# Patient Record
Sex: Male | Born: 1965 | Race: White | Hispanic: No | Marital: Married | Smoking: Never smoker
Health system: Southern US, Community
[De-identification: ages and names within clinical notes are randomized; demographics above are authoritative.]

## PROBLEM LIST (undated history)

## (undated) DIAGNOSIS — H18609 Keratoconus, unspecified, unspecified eye: Secondary | ICD-10-CM

## (undated) DIAGNOSIS — G4733 Obstructive sleep apnea (adult) (pediatric): Secondary | ICD-10-CM

## (undated) DIAGNOSIS — L719 Rosacea, unspecified: Secondary | ICD-10-CM

## (undated) DIAGNOSIS — K649 Unspecified hemorrhoids: Secondary | ICD-10-CM

## (undated) DIAGNOSIS — I480 Paroxysmal atrial fibrillation: Secondary | ICD-10-CM

## (undated) DIAGNOSIS — I493 Ventricular premature depolarization: Secondary | ICD-10-CM

## (undated) HISTORY — DX: Paroxysmal atrial fibrillation: I48.0

## (undated) HISTORY — DX: Obstructive sleep apnea (adult) (pediatric): G47.33

## (undated) HISTORY — DX: Rosacea, unspecified: L71.9

## (undated) HISTORY — PX: VASECTOMY: SHX75

## (undated) HISTORY — DX: Keratoconus, unspecified, unspecified eye: H18.609

## (undated) HISTORY — DX: Ventricular premature depolarization: I49.3

## (undated) HISTORY — DX: Unspecified hemorrhoids: K64.9

---

## 2008-10-11 ENCOUNTER — Encounter: Admission: RE | Admit: 2008-10-11 | Discharge: 2008-10-11 | Payer: Self-pay | Admitting: Gastroenterology

## 2012-10-25 ENCOUNTER — Encounter (INDEPENDENT_AMBULATORY_CARE_PROVIDER_SITE_OTHER): Payer: Self-pay | Admitting: Surgery

## 2012-10-25 ENCOUNTER — Ambulatory Visit (INDEPENDENT_AMBULATORY_CARE_PROVIDER_SITE_OTHER): Payer: BC Managed Care – PPO | Admitting: Surgery

## 2012-10-25 VITALS — BP 124/72 | HR 64 | Temp 99.0°F | Resp 18 | Ht 74.0 in | Wt 187.8 lb

## 2012-10-25 DIAGNOSIS — R1900 Intra-abdominal and pelvic swelling, mass and lump, unspecified site: Secondary | ICD-10-CM

## 2012-10-25 DIAGNOSIS — R1909 Other intra-abdominal and pelvic swelling, mass and lump: Secondary | ICD-10-CM

## 2012-10-25 NOTE — Patient Instructions (Signed)

## 2012-10-25 NOTE — Progress Notes (Signed)
Chief Complaint:  Possible Right inguinal hernia  History of Present Illness:  John Ellis is an 47 y.o. male who is very active and who was found on physical exam by Dr. Duane Lope to have a weakness in the right inguinal region. The patient denies any symptoms. He works out and a fairly Psychologist, forensic. He has not had any symptoms suggesting incarceration.  History reviewed. No pertinent past medical history.  History reviewed. No pertinent past surgical history.  Current Outpatient Prescriptions  Medication Sig Dispense Refill  . B Complex Vitamins (VITAMIN B COMPLEX PO) Take by mouth.      Marland Kitchen BETAINE PO Take by mouth.      . Cholecalciferol (VITAMIN D-3 PO) Take by mouth.      . Loratadine (CLARITIN PO) Take by mouth.      . Misc Natural Products (NF FORMULAS TESTOSTERONE PO) Take by mouth.      . Multiple Vitamins-Minerals (MULTIVITAMIN WITH MINERALS) tablet Take 1 tablet by mouth daily.      Marland Kitchen POTASSIUM BICARBONATE PO Take by mouth.      Marland Kitchen UNABLE TO FIND CYTOSINE       No current facility-administered medications for this visit.   Review of patient's allergies indicates no known allergies. Family History  Problem Relation Age of Onset  . Cancer Mother     Breast,Colon [facility] Squamous Cell Carcinoma)  . Cancer Sister     Thyroid   Social History:   reports that he has never smoked. He has never used smokeless tobacco. He reports that he drinks about 1.2 ounces of alcohol per week. He reports that he does not use illicit drugs.   REVIEW OF SYSTEMS - PERTINENT POSITIVES ONLY: noncontributory  Physical Exam:   Blood pressure 124/72, pulse 64, temperature 99 F (37.2 C), temperature source Temporal, resp. rate 18, height 6\' 2"  (1.88 m), weight 187 lb 12.8 oz (85.186 kg). Body mass index is 24.1 kg/(m^2).  Gen:  WDWN white male NAD  Neurological: Alert and oriented to person, place, and time. Motor and sensory function is grossly intact  Head: Normocephalic and  atraumatic.  Eyes: Conjunctivae are normal. Pupils are equal, round, and reactive to light. No scleral icterus.  Neck: Normal range of motion. Neck supple. No tracheal deviation or thyromegaly present.  Cardiovascular:  SR without murmurs or gallops.  No carotid bruits Respiratory: Effort normal.  No respiratory distress. No chest wall tenderness. Breath sounds normal.  No wheezes, rales or rhonchi.  Abdomen:  Very thin abdomen with well-maintained musculature. Testes without masses. No inguinal canal bulges but bilateral bulges in the internal ring region right more prominent than the left. I don't think these represent clinically significant inguinal hernias. GU: Musculoskeletal: Normal range of motion. Extremities are nontender. No cyanosis, edema or clubbing noted Lymphadenopathy: No cervical, preauricular, postauricular or axillary adenopathy is present Skin: Skin is warm and dry. No rash noted. No diaphoresis. No erythema. No pallor. Pscyh: Normal mood and affect. Behavior is normal. Judgment and thought content normal.   LABORATORY RESULTS: No results found for this or any previous visit (from the past 48 hour(s)).  RADIOLOGY RESULTS: No results found.  Problem List: There are no active problems to display for this patient.   Assessment & Plan: Faint bulges in the inguinal canals bilaterally that I don't feel are clinically significant at the present time. Patient is encouraged to continue active workouts. We'll be happy to see you can as needed.    Matt  Hortencia Conradi, MD, [facility] [phone] beeper [phone]  10/25/2012 9:46 AM

## 2015-08-25 DIAGNOSIS — Z0000 Encounter for general adult medical examination without abnormal findings: Secondary | ICD-10-CM | POA: Diagnosis not present

## 2015-11-03 DIAGNOSIS — Z0000 Encounter for general adult medical examination without abnormal findings: Secondary | ICD-10-CM | POA: Diagnosis not present

## 2015-11-07 ENCOUNTER — Other Ambulatory Visit: Payer: Self-pay | Admitting: Cardiology

## 2015-11-07 DIAGNOSIS — R000 Tachycardia, unspecified: Secondary | ICD-10-CM | POA: Diagnosis not present

## 2015-11-13 ENCOUNTER — Ambulatory Visit
Admission: RE | Admit: 2015-11-13 | Discharge: 2015-11-13 | Disposition: A | Discharge status: Home / Self Care | Payer: No Typology Code available for payment source | Source: Ambulatory Visit | Attending: Cardiology | Admitting: Cardiology

## 2015-11-13 DIAGNOSIS — R000 Tachycardia, unspecified: Secondary | ICD-10-CM

## 2015-11-26 DIAGNOSIS — I493 Ventricular premature depolarization: Secondary | ICD-10-CM | POA: Diagnosis not present

## 2015-12-22 DIAGNOSIS — I480 Paroxysmal atrial fibrillation: Secondary | ICD-10-CM | POA: Diagnosis not present

## 2015-12-23 ENCOUNTER — Other Ambulatory Visit: Payer: Self-pay | Admitting: Cardiology

## 2015-12-24 ENCOUNTER — Other Ambulatory Visit: Payer: Self-pay | Admitting: Cardiology

## 2015-12-24 DIAGNOSIS — I480 Paroxysmal atrial fibrillation: Secondary | ICD-10-CM

## 2015-12-30 ENCOUNTER — Encounter: Payer: Self-pay | Admitting: Internal Medicine

## 2016-01-09 ENCOUNTER — Institutional Professional Consult (permissible substitution): Payer: No Typology Code available for payment source | Admitting: Internal Medicine

## 2016-01-12 ENCOUNTER — Telehealth: Payer: Self-pay | Admitting: Internal Medicine

## 2016-01-12 ENCOUNTER — Ambulatory Visit (HOSPITAL_COMMUNITY)
Admission: RE | Admit: 2016-01-12 | Discharge: 2016-01-12 | Disposition: A | Discharge status: Home / Self Care | Payer: BLUE CROSS/BLUE SHIELD | Source: Ambulatory Visit | Attending: Cardiology | Admitting: Cardiology

## 2016-01-12 DIAGNOSIS — I480 Paroxysmal atrial fibrillation: Secondary | ICD-10-CM

## 2016-01-12 DIAGNOSIS — Q676 Pectus excavatum: Secondary | ICD-10-CM | POA: Insufficient documentation

## 2016-01-12 DIAGNOSIS — I4891 Unspecified atrial fibrillation: Secondary | ICD-10-CM | POA: Diagnosis not present

## 2016-01-12 LAB — CREATININE, SERUM: Creatinine, Ser: 0.9 mg/dL (ref 0.61–1.24)

## 2016-01-12 MED ORDER — GADOBENATE DIMEGLUMINE 529 MG/ML IV SOLN
30.0000 mL | Freq: Once | INTRAVENOUS | Status: AC | PRN
  Administered 2016-01-12: 30 mL via INTRAVENOUS

## 2016-01-12 NOTE — Telephone Encounter (Signed)
It looks like pt has a Cardiac MRI today ordered by Dr Donnie Ahoilley.   I was unable to reach pt to confirm this, will forward to Dr Graciela HusbandsKlein for his review.

## 2016-01-12 NOTE — Telephone Encounter (Signed)
New Message  Pt voiced he wants MD Graciela HusbandsKlein to review his results from his appt today 8.21.17 prior to his appt on 8.22.17.  Please follow up with pt. Thanks!

## 2016-01-13 ENCOUNTER — Encounter: Payer: Self-pay | Admitting: Internal Medicine

## 2016-01-13 ENCOUNTER — Ambulatory Visit (INDEPENDENT_AMBULATORY_CARE_PROVIDER_SITE_OTHER): Payer: BLUE CROSS/BLUE SHIELD | Admitting: Internal Medicine

## 2016-01-13 ENCOUNTER — Encounter (INDEPENDENT_AMBULATORY_CARE_PROVIDER_SITE_OTHER): Payer: Self-pay

## 2016-01-13 VITALS — BP 124/80 | HR 49 | Ht 74.0 in | Wt 192.0 lb

## 2016-01-13 DIAGNOSIS — I4891 Unspecified atrial fibrillation: Secondary | ICD-10-CM

## 2016-01-13 NOTE — Telephone Encounter (Signed)
The patient has an appt at 3:30 pm today.  Results to be reviewed with the patient at that time.

## 2016-01-13 NOTE — Patient Instructions (Signed)
Medication Instructions: - Your physician recommends that you continue on your current medications as directed. Please refer to the Current Medication list given to you today.  Labwork: - none  Procedures/Testing: - none  Follow-Up: - Your physician recommends that you schedule a follow-up appointment in: 3 months with Dr. Klein.  Any Additional Special Instructions Will Be Listed Below (If Applicable).     If you need a refill on your cardiac medications before your next appointment, please call your pharmacy.   

## 2016-01-14 ENCOUNTER — Telehealth: Payer: Self-pay | Admitting: Internal Medicine

## 2016-01-14 ENCOUNTER — Encounter: Payer: Self-pay | Admitting: Internal Medicine

## 2016-01-14 NOTE — Telephone Encounter (Signed)
The pt is concerned that his MRI results will not flow into his [facility] because the test was ordered by Dr Donnie Ahoilley and Dr Donnie Ahoilley does not use [facility]. He is advised to wait until Dr Donnie Ahoilley or his office calls to give him his results and that if his results are not in his MYCHART at that time to call us and will will work on getting those results released to [facility].  He verbalized understanding and thanked me for calling him back.

## 2016-01-14 NOTE — Progress Notes (Signed)
ELECTROPHYSIOLOGY CONSULT NOTE  Patient ID: John Ellis, MRN: [id number], DOB/AGE: 1966-01-11 50 y.o. Admit date: (Not on file) Date of Consult: 01/14/2016  Primary Physician:  Duane Lope, MD Primary Cardiologist: [facility] Consulting Physician [facility]  Chief Complaint: Atrial fibrillation    HPI John Ellis is a 50 y.o. male  Referred because of atrial fibrillation.  He has a history of recurrent tachypalpitations. This occurred initially in 2013/14. There was an isolated episode at that time. He recalls regular tachycardia at about 150 bpm. It developed in the context of exercise where his heart rate did not resolve normally. It was frog negative and diuretic negative. It was associated with some chest fullness but no lightheadedness or shortness of breath.  He's subsequently noticed episodes on his heart rate monitor where for brief periods of time his heart rate was in the 200 range.  Number of months later, he had an episode that occurred some hours after exercise. His heart rate abruptly became 150 750-130 range and then after 30-40 minutes terminated abruptly and subsided. It was similar to the episode from 2013/14.  He's had 2 other distinct episodes for which is AliveCor has been useful. The first occurred after dinner where he had 3 or 4 glasses of wine. He was going to bed. Tachypalpitations that were irregular in March. They persisted for 30-40 minutes and he went to sleep. There were resolved by morning. AliveCor demonstrated atrial fibrillation.  He had another episode that was somewhat like this where he felt an irregular palpitation. AliveCor recordings demonstrates sinus rhythm with PVCs.  He exercises avidly; he does a lot of weight training and does a modest amount of aerobics. He is not a long distance endurance athlete.  He takes multiple supplements but reviewing what he is taking I did not see any stimulants in the mix.  He has been evaluated for sleep apnea on 2  occasions most recently 2015 at which time his AHI was less than 2  No family history of atrial fibrillation.  Cardiac evaluation undertaken by Dr. Donnie Aho straightened a normal echocardiogram with some degree of right sided chamber enlargement right atrial enlargement and a question of left ventricular hypertrophy versus a prominent moderator band. He subsequently underwent cardiac MRI scanning that demonstrated normal right atrial size moderate right ventricular enlargement. There is no evidence of left ventricular hypertrophy. There is no gadolinium enhancement.     Past Medical History:  Diagnosis Date  . Hemorrhoids   . Keratoconus   . Obstructive sleep apnea   . Paroxysmal atrial fibrillation (HCC)   . Rosacea   . Ventricular premature depolarization       Surgical History:  Past Surgical History:  Procedure Laterality Date  . VASECTOMY       Home Meds: Prior to Admission medications   Medication Sig Start Date End Date Taking? Authorizing Provider  Alpha-Lipoic Acid 150 MG CAPS Take by mouth as directed.   Yes Historical Provider, MD  B Complex Vitamins (VITAMIN B COMPLEX PO) Take 50 mg by mouth daily.    Yes Historical Provider, MD  Cholecalciferol (VITAMIN D-3 PO) Take 5,000 mg by mouth daily.    Yes Historical Provider, MD  Loratadine (CLARITIN PO) Take 10 mg by mouth daily as needed (allergies).    Yes Historical Provider, MD  Magnesium 250 MG TABS Take 1 tablet by mouth daily.   Yes Historical Provider, MD  Multiple Vitamins-Minerals (MULTIVITAMIN WITH MINERALS) tablet Take 3 tablets by mouth daily.  Yes Historical Provider, MD  NON FORMULARY FORTIFY CARB COMPLEX - Take 80 grams by mouth daily   Yes Historical Provider, MD  NON FORMULARY BCAA - Take 3000 mg by mouth daily   Yes Historical Provider, MD  Omega-3 Fatty Acids (FISH OIL) 500 MG CAPS Take 2,000 mg by mouth daily.    Yes Historical Provider, MD  saw palmetto 80 MG capsule Take 160 mg by mouth daily.   Yes  Historical Provider, MD  Turmeric (CURCUMIN 95 PO) Take by mouth. Take as directed   Yes Historical Provider, MD  UNABLE TO FIND as directed. CYTOZYME   Yes Historical Provider, MD  Vitamin K, Phytonadione, 100 MCG TABS Take 1 tablet by mouth daily.   Yes Historical Provider, MD  WHEY PROTEIN PO Take 66 g by mouth daily.   Yes Historical Provider, MD    Allergies: No Known Allergies  Social History   Social History  . Marital status: Married    Spouse name: N/A  . Number of children: N/A  . Years of education: N/A   Occupational History  . Estate manager/land agentfinance manager    Social History Main Topics  . Smoking status: Never Smoker  . Smokeless tobacco: Never Used  . Alcohol use 1.2 oz/week    1 Cans of beer, 1 Glasses of wine per week     Comment: Weekly.  . Drug use: No  . Sexual activity: Not on file   Other Topics Concern  . Not on file   Social History Narrative  . No narrative on file     Family History  Problem Relation Age of Onset  . Cancer Mother     Breast,Colon [facility]Basaloid Squamous Cell Carcinoma)  . Cancer Sister     Thyroid     ROS:  Please see the history of present illness.     All other systems reviewed and negative.    Physical Exam: Blood pressure 124/80, pulse (!) 49, height 6\' 2"  (1.88 m), weight 192 lb (87.1 kg), SpO2 97 %. General: Well developed, well nourished male in no acute distress. Head: Normocephalic, atraumatic, sclera non-icteric, no xanthomas, nares are without discharge. EENT: normal  Lymph Nodes:  none Neck: Negative for carotid bruits. JVD not elevated. Back:without scoliosis kyphosis Lungs: Clear bilaterally to auscultation without wheezes, rales, or rhonchi. Breathing is unlabored. Heart: RRR with S1 S2. No   murmur . No rubs, or gallops appreciated. Abdomen: Soft, non-tender, non-distended with normoactive bowel sounds. No hepatomegaly. No rebound/guarding. No obvious abdominal masses. Msk:  Strength and tone appear normal for  age. Extremities: No clubbing or cyanosis. No* edema.  Distal pedal pulses are 2+ and equal bilaterally. Skin: Warm and Dry Neuro: Alert and oriented X 3. CN III-XII intact Grossly normal sensory and motor function . Psych:  Responds to questions appropriately with a normal affect.      Labs: Cardiac Enzymes No results for input(s): CKTOTAL, CKMB, TROPONINI in the last 72 hours. CBC No results found for: WBC, HGB, HCT, MCV, PLT PROTIME: No results for input(s): LABPROT, INR in the last 72 hours. Chemistry  Recent Labs Lab 01/12/16 1110  CREATININE 0.90   Lipids No results found for: CHOL, HDL, LDLCALC, TRIG BNP No results found for: PROBNP Thyroid Function Tests: No results for input(s): TSH, T4TOTAL, T3FREE, THYROIDAB in the last 72 hours.  Invalid input(s): FREET3 Miscellaneous No results found for: DDIMER  Radiology/Studies:  Mr Card Morphology Wo/w Cm  Result Date: 01/13/2016 CLINICAL DATA:  Atrial fibrillation,  assess for structural cardiac abnormalities EXAM: CARDIAC MRI TECHNIQUE: The patient was scanned on a 1.5 Tesla GE magnet. A dedicated cardiac coil was used. Functional imaging was done using Fiesta sequences. 2,3, and 4 chamber views were done to assess for RWMA's. Modified Simpson's rule using a short axis stack was used to calculate an ejection fraction on a dedicated work Research officer, trade unionstation using Circle software. The patient received 30 cc of Multihance. After 10 minutes inversion recovery sequences were used to assess for infiltration and scar tissue. CONTRAST:  30 cc Multihance FINDINGS: The patient was noted to have pectus excavatum. Limited images of the lung fields showed no gross abnormalities. The left ventricle was normal in size and thickness. EF 65% with normal wall motion. Pectus excavatum creates some compression of RV, especially from the mid RV to the apical RV. The RV is moderately dilated by volume but systolic function is normal, RV EF 53%. The left atrium is  mildly dilated, the right atrium is normal in size. Aortic valve is trileaflet with no significant stenosis or regurgitation. There did not appear to be significant mitral regurgitation though flow sequences for quantitative assessment were not done. The aorta did not appear dilated. T1 images looked normal. On delayed enhancement imaging, there was no myocardial late gadolinium enhancement (LGE). MEASUREMENTS: MEASUREMENTS LV EDV 203 mL LV SV 132 mL LV EF 65% RV EDV 249 mL RV SV 131 mL RV EF 53% IMPRESSION: 1.  Normal LV size and systolic function, EF 65%. 2. Moderate dilated RV with normal systolic function, EF 53%. The patient has pectus excavatum which leads to some compression on the RV, especially the mid wall and apex. This may have led to alterations in RV volume. 3. No myocardial LGE (RV or LV), so no evidence for infiltrative disease, prior MI, or prior myocarditis. 4.  I do not suspect that ARVC is present. Dalton Mclean Electronically Signed   By: Marca Anconaalton  Mclean M.D.   On: 01/13/2016 09:00    EKG: Sinus and 49 Intervals 16/11/43 RSR prime*   Assessment and Plan:   Atrial fibrillation-paroxysmal  PVCs  Tachypalpitations-regular  Pectus    The patient has a constellation of symptoms. His atrial fibrillation occurred in the context of alcohol. This might have been the trigger. The other episode that he thought was atrial fibrillation turns out to be sinus with PVCs. His PVC burden does not seem to be excessive and there is no evidence of LV Function. At This Juncture, I Recommended That We Continue without intervention.  His thromboembolic risk profile sufficiently low that no anticoagulation or antiplatelet therapy is indicated.  We discussed also data related to endurance athletes and atrial fibrillation burden.  His sleep study reports were reviewed. His most recent AHI was less than 2. I don't know that there is any value in pursuing therapy for this.  His MRI scan failed to  demonstrate an explanation for right sided enlargement.  Apparently some degree of pectus can impact right ventricular outflow in his right ventricular function although I don't find comments impacting  right ventricular size.  I discussed this with Dr. DM; he thought the pectus might be contributing.         Sherryl MangesSteven Tsering Leaman

## 2016-01-14 NOTE — Telephone Encounter (Signed)
New message       Pt is calling to ask that his MRI test results be posted on mychart when read.  If any problems, please call

## 2016-01-15 ENCOUNTER — Encounter: Payer: Self-pay | Admitting: Cardiology

## 2016-01-15 DIAGNOSIS — I480 Paroxysmal atrial fibrillation: Secondary | ICD-10-CM | POA: Insufficient documentation

## 2016-01-20 ENCOUNTER — Telehealth: Payer: Self-pay | Admitting: Internal Medicine

## 2016-01-20 NOTE — Telephone Encounter (Signed)
New message        Pt reviewed his mychart and read that his EKG was abnormal. He would like to have the nurse call and discuss details with him. Please call.

## 2016-01-20 NOTE — Telephone Encounter (Signed)
Will forward to MD to review 

## 2016-01-22 NOTE — Telephone Encounter (Signed)
Late entry- Dr. Graciela HusbandsKlein attempted to call the patient on 8/30

## 2016-01-27 NOTE — Telephone Encounter (Signed)
Per Dr. Graciela HusbandsKlein- he spoke with the patient.

## 2016-04-09 ENCOUNTER — Encounter: Payer: Self-pay | Admitting: Internal Medicine

## 2016-04-14 DIAGNOSIS — R972 Elevated prostate specific antigen [PSA]: Secondary | ICD-10-CM | POA: Diagnosis not present

## 2016-04-20 ENCOUNTER — Ambulatory Visit (INDEPENDENT_AMBULATORY_CARE_PROVIDER_SITE_OTHER): Payer: BLUE CROSS/BLUE SHIELD | Admitting: Internal Medicine

## 2016-04-20 ENCOUNTER — Encounter: Payer: Self-pay | Admitting: Internal Medicine

## 2016-04-20 VITALS — BP 116/72 | HR 56 | Ht 74.0 in | Wt 198.0 lb

## 2016-04-20 DIAGNOSIS — I480 Paroxysmal atrial fibrillation: Secondary | ICD-10-CM

## 2016-04-20 DIAGNOSIS — I493 Ventricular premature depolarization: Secondary | ICD-10-CM | POA: Diagnosis not present

## 2016-04-20 MED ORDER — FLECAINIDE ACETATE 150 MG PO TABS: ORAL_TABLET | ORAL | 0 refills | Status: DC

## 2016-04-20 MED ORDER — APIXABAN 5 MG PO TABS: 5.0000 mg | ORAL_TABLET | Freq: Two times a day (BID) | ORAL | 0 refills | Status: DC

## 2016-04-20 NOTE — Patient Instructions (Signed)
Medication Instructions: - Your physician has recommended you make the following change in your medication:  1) Start eliquis 5 mg- take one tablet by mouth twice daily 2) Start flecainide 150 mg - take two tablets (300 mg) by mouth once as directed  Labwork: -  None ordered  Procedures/Testing: - none ordered  Follow-Up: - Your physician wants you to follow-up in: 6 months with Dr. Graciela HusbandsKlein. You will receive a reminder letter in the mail two months in advance. If you don't receive a letter, please call our office to schedule the follow-up appointment.  Any Additional Special Instructions Will Be Listed Below (If Applicable).     If you need a refill on your cardiac medications before your next appointment, please call your pharmacy.

## 2016-04-20 NOTE — Progress Notes (Signed)
Patient Care Team: Daisy Floroharles Alan Ross, MD as PCP - General (Family Medicine)   HPI  John Ellis is a 50 y.o. male seen in follow-up for paroxysmal atrial fibrillation. He has had no interval events  He has had a couple of episodes of tachypalpitations. Using the AliveCor monitor 1 was clearly atrial fibrillation. Another was associated with PVCs. He has also had regular tachycardia in the past.   The episode of atrial fibrillation was associated with an extra amount of alcohol intake.  Echocardiogram l t ([facility]) was normal       Past Medical History:  Diagnosis Date  . Hemorrhoids   . Keratoconus   . Obstructive sleep apnea   . Paroxysmal atrial fibrillation (HCC)   . Rosacea   . Ventricular premature depolarization     Past Surgical History:  Procedure Laterality Date  . VASECTOMY      Current Outpatient Prescriptions  Medication Sig Dispense Refill  . Alpha-Lipoic Acid 150 MG CAPS Take by mouth as directed.    . B Complex Vitamins (VITAMIN B COMPLEX PO) Take 50 mg by mouth daily.     . Cholecalciferol (VITAMIN D-3 PO) Take 5,000 mg by mouth daily.     . Loratadine (CLARITIN PO) Take 10 mg by mouth daily as needed (allergies).     . Magnesium 250 MG TABS Take 1 tablet by mouth daily.    . Multiple Vitamins-Minerals (MULTIVITAMIN WITH MINERALS) tablet Take 3 tablets by mouth daily.     . NON FORMULARY FORTIFY CARB COMPLEX - Take 80 grams by mouth daily    . NON FORMULARY BCAA - Take 3000 mg by mouth daily    . Omega-3 Fatty Acids (FISH OIL) 500 MG CAPS Take 2,000 mg by mouth daily.     . saw palmetto 80 MG capsule Take 160 mg by mouth daily.    . Turmeric (CURCUMIN 95 PO) Take by mouth. Take as directed    . UNABLE TO FIND as directed. CYTOZYME    . Vitamin K, Phytonadione, 100 MCG TABS Take 1 tablet by mouth daily.    . WHEY PROTEIN PO Take 66 g by mouth daily.    Marland Kitchen. apixaban (ELIQUIS) 5 MG TABS tablet Take 1 tablet (5 mg total) by mouth 2 (two) times daily.  28 tablet 0  . flecainide (TAMBOCOR) 150 MG tablet Take two tablets (300 mg) by mouth once as directed 4 tablet 0   No current facility-administered medications for this visit.     No Known Allergies    Review of Systems negative except from HPI and PMH  Physical Exam BP 116/72   Pulse (!) 56   Ht 6\' 2"  (1.88 m)   Wt 198 lb (89.8 kg)   SpO2 98%   BMI 25.42 kg/m  Well developed and well nourished in no acute distress HENT normal E scleral and icterus clear Neck Supple JVP flat; carotids brisk and full Clear to ausculation Regular rate and rhythm, no murmurs gallops or rub Soft with active bowel sounds No clubbing cyanosis  Edema Alert and oriented, grossly normal motor and sensory function Skin Warm and Dry  ECG demonstrates sinus rhythm at 54 Intervals 16/11/44 Incomplete right bundle branch block   Assessment and  Plan Atrial fib  PVCs  Regular tachypalpitations   Discussed options [place]. I advised to minimize his alcohol intake as this seemed to benefit trigger.  In the event that he has  atrial fibrillation that persists until the next day, he will try taking flecainide 300 mg  He is aware that normally we exposed the first time in hospital.  We will also give him one week's samples worth of apixoban. In the event that he wakes up the second warning still out of rhythm he will begin taking his ELIQUIS. He is advised to go to the emergency room locally if necessary otherwise we will see him when he returns home  We spent more than 50% of our >25 min visit in face to face counseling regarding the above        Current medicines are reviewed at length with the patient today .  The patient does not  have concerns regarding medicines.

## 2016-04-21 DIAGNOSIS — N401 Enlarged prostate with lower urinary tract symptoms: Secondary | ICD-10-CM | POA: Diagnosis not present

## 2016-04-21 DIAGNOSIS — N5201 Erectile dysfunction due to arterial insufficiency: Secondary | ICD-10-CM | POA: Diagnosis not present

## 2016-04-21 DIAGNOSIS — R972 Elevated prostate specific antigen [PSA]: Secondary | ICD-10-CM | POA: Diagnosis not present

## 2016-04-21 DIAGNOSIS — R3915 Urgency of urination: Secondary | ICD-10-CM | POA: Diagnosis not present

## 2016-04-23 DIAGNOSIS — Z86010 Personal history of colonic polyps: Secondary | ICD-10-CM | POA: Diagnosis not present

## 2016-04-23 DIAGNOSIS — Z8371 Family history of colonic polyps: Secondary | ICD-10-CM | POA: Diagnosis not present

## 2016-04-23 DIAGNOSIS — Z800 Family history of malignant neoplasm of digestive organs: Secondary | ICD-10-CM | POA: Diagnosis not present

## 2016-04-23 DIAGNOSIS — Z8679 Personal history of other diseases of the circulatory system: Secondary | ICD-10-CM | POA: Diagnosis not present

## 2016-05-14 DIAGNOSIS — J209 Acute bronchitis, unspecified: Secondary | ICD-10-CM | POA: Diagnosis not present

## 2016-05-27 DIAGNOSIS — B029 Zoster without complications: Secondary | ICD-10-CM | POA: Diagnosis not present

## 2016-06-07 DIAGNOSIS — B029 Zoster without complications: Secondary | ICD-10-CM | POA: Diagnosis not present

## 2016-06-17 DIAGNOSIS — B0229 Other postherpetic nervous system involvement: Secondary | ICD-10-CM | POA: Diagnosis not present

## 2016-06-17 DIAGNOSIS — L578 Other skin changes due to chronic exposure to nonionizing radiation: Secondary | ICD-10-CM | POA: Diagnosis not present

## 2016-08-13 DIAGNOSIS — J358 Other chronic diseases of tonsils and adenoids: Secondary | ICD-10-CM | POA: Diagnosis not present

## 2016-08-13 DIAGNOSIS — R0981 Nasal congestion: Secondary | ICD-10-CM | POA: Diagnosis not present

## 2016-08-13 DIAGNOSIS — G4730 Sleep apnea, unspecified: Secondary | ICD-10-CM | POA: Diagnosis not present

## 2016-09-02 IMAGING — MR MR CARD MORPHOLOGY WO/W CM
14 of 15 series · 39 of 40 positions shown · IV contrast (30    MH)
Comparison: none

CLINICAL DATA: Atrial fibrillation, assess for structural cardiac
abnormalities

EXAM:
CARDIAC MRI
TECHNIQUE: The patient was scanned on a 1.5 Tesla GE magnet. A dedicated
cardiac coil was used. Functional imaging was done using Fiesta
sequences. [DATE], and 4 chamber views were done to assess for RWMA's.
Modified Erxleben rule using a short axis stack was used to
calculate an ejection fraction on a dedicated work station using
Circle software. The patient received 30 cc of Multihance. After 10
minutes inversion recovery sequences were used to assess for
infiltration and scar tissue.
CONTRAST:  30 cc Multihance

[Series 3: bSSFP · sagittal · 8.0mm · 1.37mm/px · 1 of 18 slices shown (1 of 8)]
[im 1/18]
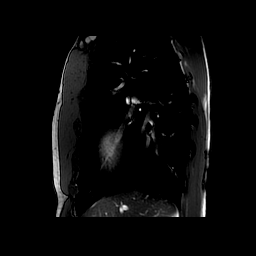

[Series 4: bSSFP · coronal · 8.0mm · 1.37mm/px · 1 of 20 slices shown (2 of 8)]
[im 1/20]
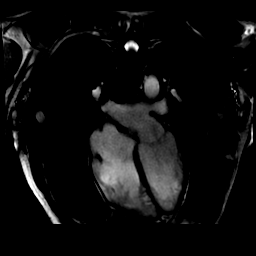

[Series 5: bSSFP · coronal · 8.0mm · 1.37mm/px · 1 of 80 slices shown (3 of 8)]
[im 1/80]
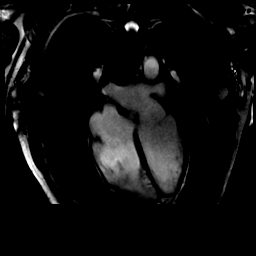

[Series 8: bSSFP · coronal · 8.0mm · 1.52mm/px · 1 of 80 slices shown (4 of 8)]
[im 1/80]
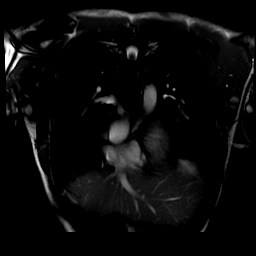

[Series 10: bSSFP · axial · 8.0mm · 1.25mm/px · z∈[-39,+65]mm · 9 of 340 slices shown (5 of 8)]
[im 1/340]
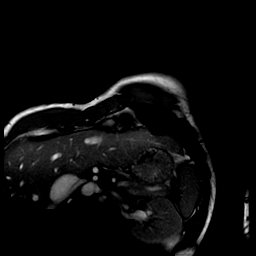
[im 43/340]
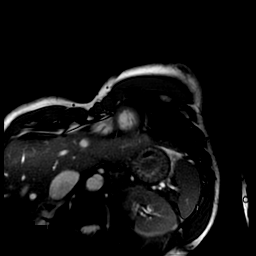
[im 85/340]
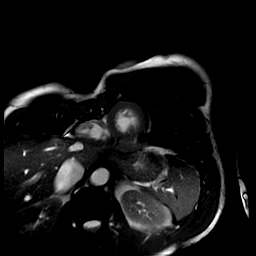
[im 128/340]
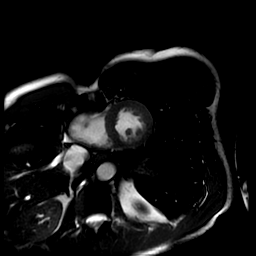
[im 170/340]
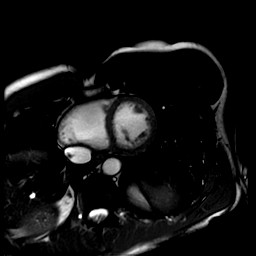
[im 212/340]
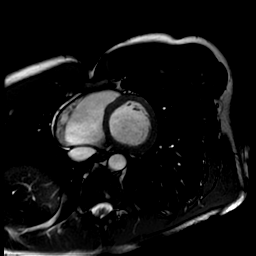
[im 255/340]
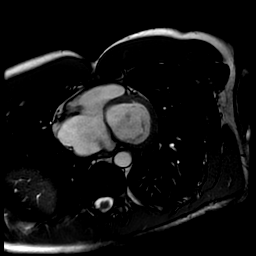
[im 297/340]
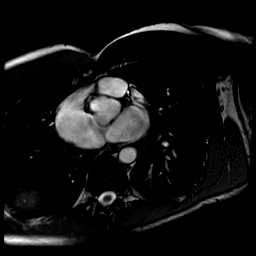
[im 340/340]
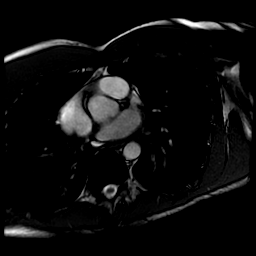

[Series 11: bSSFP · oblique · 8.0mm · 1.25mm/px · 3 of 140 slices shown (6 of 8)]
[im 1/140]
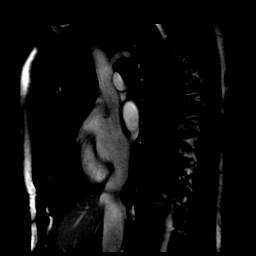
[im 70/140]
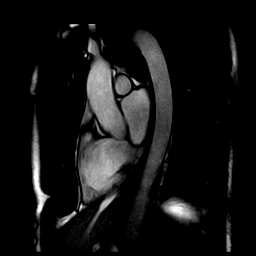
[im 140/140]
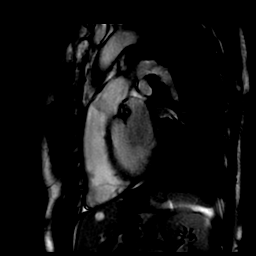

[Series 12: T1 · axial · 6.0mm · 1.17mm/px · 1 of 5 slices shown]
[im 1/5]
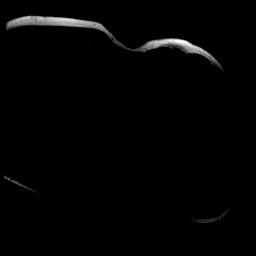

[Series 13: axialfiesta · axial · 8.0mm · 1.25mm/px · z∈[-108,+71]mm · 11 of 380 slices shown]
[im 1/380]
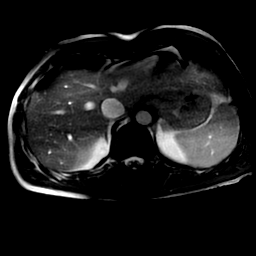
[im 38/380]
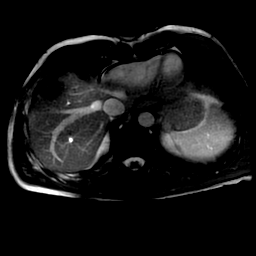
[im 76/380]
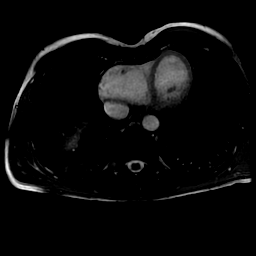
[im 114/380]
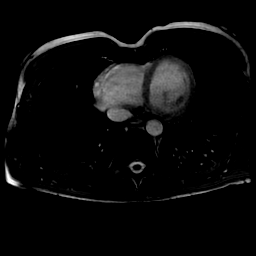
[im 152/380]
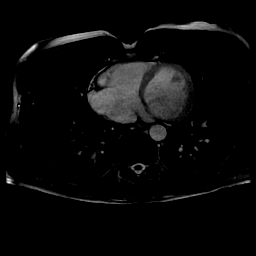
[im 190/380]
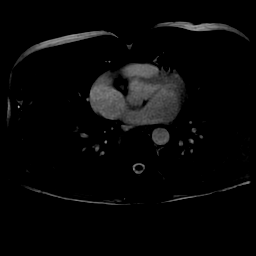
[im 228/380]
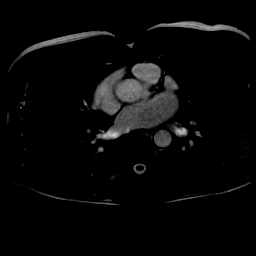
[im 266/380]
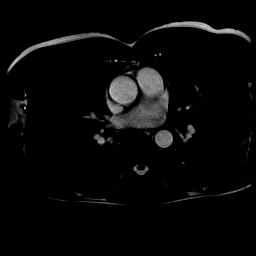
[im 304/380]
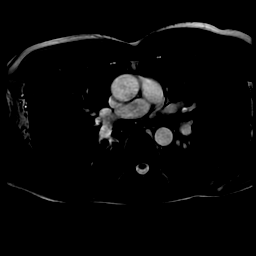
[im 342/380]
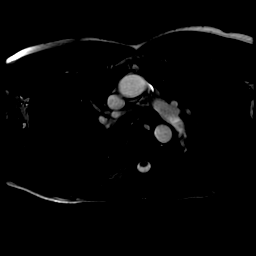
[im 380/380]
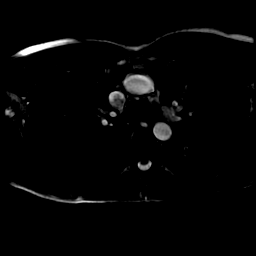

[Series 14: bSSFP · oblique · 6.0mm · 1.25mm/px · 5 of 160 slices shown (7 of 8)]
[im 1/160]
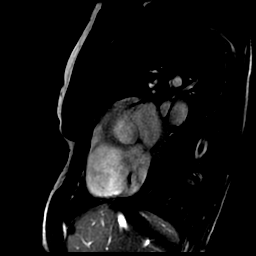
[im 40/160]
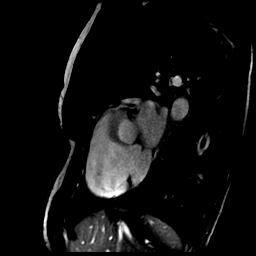
[im 80/160]
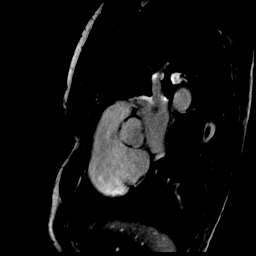
[im 120/160]
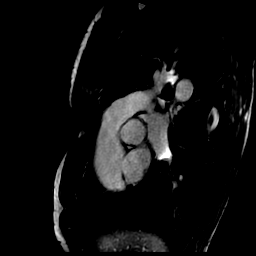
[im 160/160]
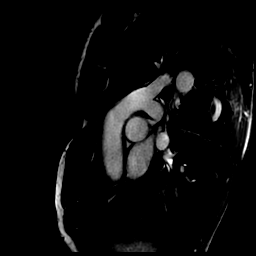

[Series 15: bSSFP · coronal · 8.0mm · 1.56mm/px · 2 of 60 slices shown (8 of 8)]
[im 1/60]
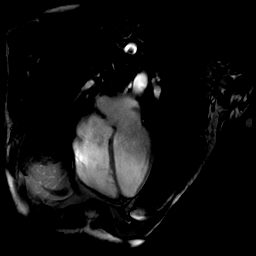
[im 60/60]
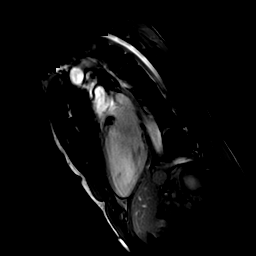

[Series 16: cine ir · axial · 8.0mm · 1.25mm/px · 1 of 30 slices shown]
[im 1/30]
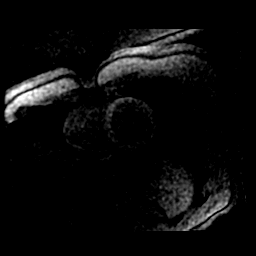

[Series 20: delayed ir prep · axial · 8.0mm · 1.25mm/px · 1 of 14 slices shown]
[im 1/14]
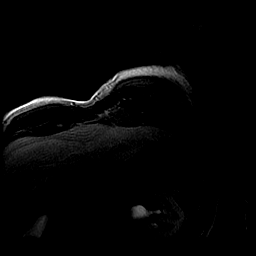

[Series 21: rad mde · coronal · 8.0mm · 1.56mm/px · 1 of 3 slices shown]
[im 1/3]
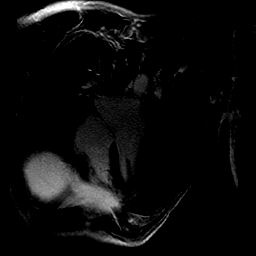

[Series 1000: processed images · axial · 8.0mm · 0.62mm/px · 1 of 1 slices shown]
[im 1/1]
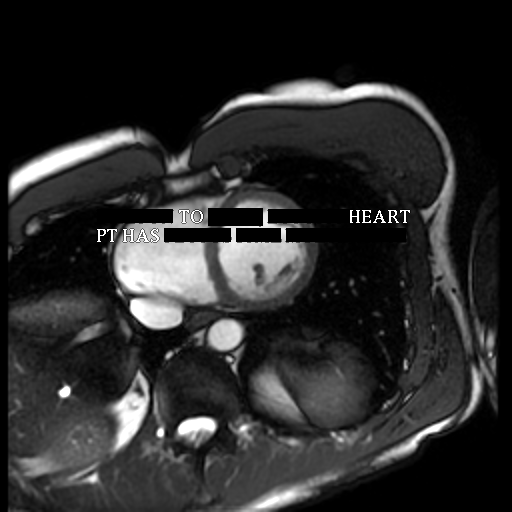

[39 of 40 positions shown; findings below may reference images not displayed]

FINDINGS: The patient was noted to have pectus excavatum. Limited images of
the lung fields showed no gross abnormalities.

The left ventricle was normal in size and thickness. EF 65% with
normal wall motion. Pectus excavatum creates some compression of RV,
especially from the mid RV to the apical RV. The RV is moderately
dilated by volume but systolic function is normal, RV EF 53%. The
left atrium is mildly dilated, the right atrium is normal in size.
Aortic valve is trileaflet with no significant stenosis or
regurgitation. There did not appear to be significant mitral
regurgitation though flow sequences for quantitative assessment were
not done. The aorta did not appear dilated. T1 images looked normal.

On delayed enhancement imaging, there was no myocardial late
gadolinium enhancement (LGE).

MEASUREMENTS:
MEASUREMENTS
LV EDV 203 mL

LV SV 132 mL

LV EF 65%

RV EDV 249 mL

RV SV 131 mL

RV EF 53%
IMPRESSION: 1.  Normal LV size and systolic function, EF 65%.

2. Moderate dilated RV with normal systolic function, EF 53%. The
patient has pectus excavatum which leads to some compression on the
RV, especially the mid wall and apex. This may have led to
alterations in RV volume.

3. No myocardial LGE (RV or LV), so no evidence for infiltrative
disease, prior MI, or prior myocarditis.

4.  I do not suspect that ARVC is present.

Ju Kamara

## 2016-10-08 DIAGNOSIS — S30861A Insect bite (nonvenomous) of abdominal wall, initial encounter: Secondary | ICD-10-CM | POA: Diagnosis not present

## 2016-10-08 DIAGNOSIS — W57XXXA Bitten or stung by nonvenomous insect and other nonvenomous arthropods, initial encounter: Secondary | ICD-10-CM | POA: Diagnosis not present

## 2016-10-13 DIAGNOSIS — J3501 Chronic tonsillitis: Secondary | ICD-10-CM | POA: Diagnosis not present

## 2016-10-25 DIAGNOSIS — Z0000 Encounter for general adult medical examination without abnormal findings: Secondary | ICD-10-CM | POA: Diagnosis not present

## 2016-11-26 DIAGNOSIS — Z0000 Encounter for general adult medical examination without abnormal findings: Secondary | ICD-10-CM | POA: Diagnosis not present

## 2016-11-26 DIAGNOSIS — Z136 Encounter for screening for cardiovascular disorders: Secondary | ICD-10-CM | POA: Diagnosis not present

## 2016-11-26 DIAGNOSIS — R7309 Other abnormal glucose: Secondary | ICD-10-CM | POA: Diagnosis not present

## 2017-03-01 DIAGNOSIS — N5201 Erectile dysfunction due to arterial insufficiency: Secondary | ICD-10-CM | POA: Diagnosis not present

## 2017-03-01 DIAGNOSIS — R972 Elevated prostate specific antigen [PSA]: Secondary | ICD-10-CM | POA: Diagnosis not present

## 2017-03-01 DIAGNOSIS — N400 Enlarged prostate without lower urinary tract symptoms: Secondary | ICD-10-CM | POA: Diagnosis not present

## 2017-03-11 DIAGNOSIS — J3501 Chronic tonsillitis: Secondary | ICD-10-CM | POA: Diagnosis not present

## 2017-03-11 DIAGNOSIS — J038 Acute tonsillitis due to other specified organisms: Secondary | ICD-10-CM | POA: Diagnosis not present

## 2017-03-18 ENCOUNTER — Ambulatory Visit (INDEPENDENT_AMBULATORY_CARE_PROVIDER_SITE_OTHER): Payer: BLUE CROSS/BLUE SHIELD | Admitting: Internal Medicine

## 2017-03-18 ENCOUNTER — Encounter: Payer: Self-pay | Admitting: Internal Medicine

## 2017-03-18 VITALS — BP 126/80 | HR 57 | Ht 74.0 in | Wt 201.0 lb

## 2017-03-18 DIAGNOSIS — I480 Paroxysmal atrial fibrillation: Secondary | ICD-10-CM | POA: Diagnosis not present

## 2017-03-18 DIAGNOSIS — I493 Ventricular premature depolarization: Secondary | ICD-10-CM | POA: Diagnosis not present

## 2017-03-18 NOTE — Progress Notes (Signed)
      Patient Care Team: Daisy Florooss, Charles Alan, MD as PCP - General (Family Medicine)   HPI  John MendsRichard Niven is a 51 y.o. male seen in follow-up for paroxysmal atrial fibrillation and PVCs.   Using the AliveCor monitor 1 was clearly atrial fibrillation. Another was associated with PVCs. He has also had regular tachycardia in the past.  The episode of atrial fibrillation was associated with an extra amount of alcohol intake.  DATE TEST    8/17    Echo   EF normal % RV enlargement  8/17    MRI   EF 65 % Mod dilated RV assoc pectus         No interval atrial fibrillation. Occasional palpitations. No changes in exercise tolerance.      Past Medical History:  Diagnosis Date  . Hemorrhoids   . Keratoconus   . Obstructive sleep apnea   . Paroxysmal atrial fibrillation (HCC)   . Rosacea   . Ventricular premature depolarization     Past Surgical History:  Procedure Laterality Date  . VASECTOMY      Current Outpatient Prescriptions  Medication Sig Dispense Refill  . Cholecalciferol (VITAMIN D-3 PO) Take 5,000 mg by mouth daily.     . Loratadine (CLARITIN PO) Take 10 mg by mouth daily as needed (allergies).     . NON FORMULARY BCAA - Take 3000 mg by mouth daily    . Omega-3 Fatty Acids (FISH OIL) 500 MG CAPS Take 2,000 mg by mouth daily.     . WHEY PROTEIN PO Take 66 g by mouth daily.     No current facility-administered medications for this visit.     No Known Allergies    Review of Systems negative except from HPI and PMH  Physical Exam BP 126/80   Pulse (!) 57   Ht 6\' 2"  (1.88 m)   Wt 201 lb (91.2 kg)   SpO2 98%   BMI 25.81 kg/m  Well developed and well nourished in no acute distress HENT normal E scleral and icterus clear Neck Supple JVP flat; carotids brisk and full Clear to ausculation Regular rate and rhythm, no murmurs gallops or rub Soft with active bowel sounds No clubbing cyanosis  Edema Alert and oriented, grossly normal motor and sensory  function Skin Warm and Dry  ECG demonstrates sinus rhythm at 59 Intervals 16/10/48 Incomplete right bundle branch block  And PVC LBBB Inferior axis morphology  fractionated  Assessment and  Plan Atrial fib  PVCs Fractionated   Regular tachypalpitations  RV enlargement by echo/ MRI 2017    The patient's PVCs which are fractionated are frequent. A couple literature search is failed to identify any reports on the implications of fractionated PVCs out of the context of fractionated QRSs of which he has none.  We will quantitate them and if they see a-10% we will obtain an echo.  We also discussed the issues of RV enlargement. There is no evidence of volume overload i.e. shunts, no evidence of RV myocardial disease, we do have the pectus which could compress and cause it to appear Abnormal. He is also a fit and intense in his training. There is some possibility that represents the consequences of exercise-induced pulmonary hypertension  No interval atrial fibrillation  More than 50% of 40 min was spent in counseling related to the above  .

## 2017-03-18 NOTE — Patient Instructions (Addendum)
Medication Instructions:  Your physician recommends that you continue on your current medications as directed. Please refer to the Current Medication list given to you today.  Labwork: None ordered  Testing/Procedures: Your physician has recommended that you wear a 48 hour holter monitor. Holter monitors are medical devices that record the heart's electrical activity. Doctors most often use these monitors to diagnose arrhythmias. Arrhythmias are problems with the speed or rhythm of the heartbeat. The monitor is a small, portable device. You can wear one while you do your normal daily activities. This is usually used to diagnose what is causing palpitations/syncope (passing out).  Follow-Up: Your physician wants you to follow-up in: 6 months with Dr. Graciela HusbandsKlein.  You will receive a reminder letter in the mail two months in advance. If you don't receive a letter, please call our office to schedule the follow-up appointment.  -- If you need a refill on your cardiac medications before your next appointment, please call your pharmacy. --  Thank you for choosing [facility]!!    Any Other Special Instructions Will Be Listed Below (If Applicable).   Holter Monitoring A Holter monitor is a small device that is used to detect abnormal heart rhythms. It clips to your clothing and is connected by wires to flat, sticky disks (electrodes) that attach to your chest. It is worn continuously for 24-48 hours. Follow these instructions at home:  Wear your Holter monitor at all times, even while exercising and sleeping, for as long as directed by your health care provider.  Make sure that the Holter monitor is safely clipped to your clothing or close to your body as recommended by your health care provider.  Do not get the monitor or wires wet.  Do not put body lotion or moisturizer on your chest.  Keep your skin clean.  Keep a diary of your daily activities, such as walking and doing chores. If you  feel that your heartbeat is abnormal or that your heart is fluttering or skipping a beat: ? Record what you are doing when it happens. ? Record what time of day the symptoms occur.  Return your Holter monitor as directed by your health care provider.  Keep all follow-up visits as directed by your health care provider. This is important. Get help right away if:  You feel lightheaded or you faint.  You have trouble breathing.  You feel pain in your chest, upper arm, or jaw.  You feel sick to your stomach and your skin is pale, cool, or damp.  You heartbeat feels unusual or abnormal. This information is not intended to replace advice given to you by your health care provider. Make sure you discuss any questions you have with your health care provider. Document Released: 02/06/2004 Document Revised: 10/16/2015 Document Reviewed: 12/17/2013 Elsevier Interactive Patient Education  [employer]2018 Elsevier Inc.

## 2017-03-23 ENCOUNTER — Ambulatory Visit (INDEPENDENT_AMBULATORY_CARE_PROVIDER_SITE_OTHER): Payer: BLUE CROSS/BLUE SHIELD

## 2017-03-23 DIAGNOSIS — I493 Ventricular premature depolarization: Secondary | ICD-10-CM | POA: Diagnosis not present

## 2017-04-04 ENCOUNTER — Telehealth: Payer: Self-pay

## 2017-04-04 NOTE — Telephone Encounter (Signed)
Pt is aware and agreeable to monitor results.

## 2017-05-13 DIAGNOSIS — M25519 Pain in unspecified shoulder: Secondary | ICD-10-CM | POA: Diagnosis not present

## 2017-05-13 DIAGNOSIS — M25611 Stiffness of right shoulder, not elsewhere classified: Secondary | ICD-10-CM | POA: Diagnosis not present

## 2017-05-13 DIAGNOSIS — M6281 Muscle weakness (generalized): Secondary | ICD-10-CM | POA: Diagnosis not present

## 2017-05-20 DIAGNOSIS — M25519 Pain in unspecified shoulder: Secondary | ICD-10-CM | POA: Diagnosis not present

## 2017-05-20 DIAGNOSIS — M6281 Muscle weakness (generalized): Secondary | ICD-10-CM | POA: Diagnosis not present

## 2017-05-20 DIAGNOSIS — M25611 Stiffness of right shoulder, not elsewhere classified: Secondary | ICD-10-CM | POA: Diagnosis not present

## 2017-06-03 DIAGNOSIS — M25611 Stiffness of right shoulder, not elsewhere classified: Secondary | ICD-10-CM | POA: Diagnosis not present

## 2017-06-03 DIAGNOSIS — M25519 Pain in unspecified shoulder: Secondary | ICD-10-CM | POA: Diagnosis not present

## 2017-06-03 DIAGNOSIS — M6281 Muscle weakness (generalized): Secondary | ICD-10-CM | POA: Diagnosis not present

## 2017-06-16 DIAGNOSIS — M25611 Stiffness of right shoulder, not elsewhere classified: Secondary | ICD-10-CM | POA: Diagnosis not present

## 2017-06-16 DIAGNOSIS — M25519 Pain in unspecified shoulder: Secondary | ICD-10-CM | POA: Diagnosis not present

## 2017-06-16 DIAGNOSIS — M6281 Muscle weakness (generalized): Secondary | ICD-10-CM | POA: Diagnosis not present

## 2017-06-27 DIAGNOSIS — M6281 Muscle weakness (generalized): Secondary | ICD-10-CM | POA: Diagnosis not present

## 2017-06-27 DIAGNOSIS — M25611 Stiffness of right shoulder, not elsewhere classified: Secondary | ICD-10-CM | POA: Diagnosis not present

## 2017-06-27 DIAGNOSIS — L578 Other skin changes due to chronic exposure to nonionizing radiation: Secondary | ICD-10-CM | POA: Diagnosis not present

## 2017-06-27 DIAGNOSIS — M25519 Pain in unspecified shoulder: Secondary | ICD-10-CM | POA: Diagnosis not present

## 2017-06-27 DIAGNOSIS — Z09 Encounter for follow-up examination after completed treatment for conditions other than malignant neoplasm: Secondary | ICD-10-CM | POA: Diagnosis not present

## 2017-06-27 DIAGNOSIS — Z872 Personal history of diseases of the skin and subcutaneous tissue: Secondary | ICD-10-CM | POA: Diagnosis not present

## 2017-07-06 DIAGNOSIS — M6281 Muscle weakness (generalized): Secondary | ICD-10-CM | POA: Diagnosis not present

## 2017-07-06 DIAGNOSIS — M25519 Pain in unspecified shoulder: Secondary | ICD-10-CM | POA: Diagnosis not present

## 2017-07-06 DIAGNOSIS — M25611 Stiffness of right shoulder, not elsewhere classified: Secondary | ICD-10-CM | POA: Diagnosis not present

## 2017-07-20 DIAGNOSIS — M6281 Muscle weakness (generalized): Secondary | ICD-10-CM | POA: Diagnosis not present

## 2017-07-20 DIAGNOSIS — M25611 Stiffness of right shoulder, not elsewhere classified: Secondary | ICD-10-CM | POA: Diagnosis not present

## 2017-07-20 DIAGNOSIS — M25519 Pain in unspecified shoulder: Secondary | ICD-10-CM | POA: Diagnosis not present

## 2017-09-07 ENCOUNTER — Encounter: Payer: Self-pay | Admitting: Internal Medicine

## 2017-09-19 ENCOUNTER — Ambulatory Visit: Payer: BLUE CROSS/BLUE SHIELD | Admitting: Internal Medicine

## 2017-09-19 ENCOUNTER — Encounter: Payer: Self-pay | Admitting: Internal Medicine

## 2017-09-19 VITALS — BP 122/80 | HR 60 | Ht 74.0 in | Wt 194.4 lb

## 2017-09-19 DIAGNOSIS — I493 Ventricular premature depolarization: Secondary | ICD-10-CM | POA: Diagnosis not present

## 2017-09-19 DIAGNOSIS — I480 Paroxysmal atrial fibrillation: Secondary | ICD-10-CM | POA: Diagnosis not present

## 2017-09-19 NOTE — Patient Instructions (Addendum)

## 2017-09-19 NOTE — Progress Notes (Signed)
      Patient Care Team: Daisy Floro, MD as PCP - General (Family Medicine)   HPI  John Ellis is a 52 y.o. male seen in follow-up for paroxysmal atrial fibrillation and PVCs.   Using the AliveCor monitor 1 was clearly atrial fibrillation. Another was associated with PVCs. He has also had regular tachycardia in the past.  The episode of atrial fibrillation was associated with an extra amount of alcohol intake.  DATE TEST    8/17    Echo   EF normal % RV enlargement  8/17    MRI   EF 65 % Mod dilated RV assoc pectus        (There is no evidence of volume overload i.e. shunts, no evidence of RV myocardial disease, we do have the pectus which could compress and cause it to appear Abnormal. He is also a fit and intense in his training. There is some possibility that represents the consequences of exercise-induced pulmonary hypertension)  No interval atrial fibrillation.  Less PVCs.  Date Holter--PVC %  10/18 5.3       He notes also a protuberance on his right occiput    Past Medical History:  Diagnosis Date  . Hemorrhoids   . Keratoconus   . Obstructive sleep apnea   . Paroxysmal atrial fibrillation (HCC)   . Rosacea   . Ventricular premature depolarization     Past Surgical History:  Procedure Laterality Date  . VASECTOMY      Current Outpatient Medications  Medication Sig Dispense Refill  . Cholecalciferol (VITAMIN D-3 PO) Take 5,000 mg by mouth daily.     . Loratadine (CLARITIN PO) Take 10 mg by mouth daily as needed (allergies).     . NON FORMULARY BCAA - Take 3000 mg by mouth daily    . Omega-3 Fatty Acids (FISH OIL) 500 MG CAPS Take 2,000 mg by mouth daily.     . WHEY PROTEIN PO Take 66 g by mouth daily.     No current facility-administered medications for this visit.     No Known Allergies    Review of Systems negative except from HPI and PMH  Physical Exam BP 122/80 (BP Location: Left Arm, Patient Position: Sitting)   Pulse 60   Ht   (1.88 m)   Wt 194 lb 6.4 oz (88.2 kg)   SpO2 97%   BMI 24.96 kg/m  Well developed and nourished in no acute distress HENT there is a soft tissue fullness on his right occiput-nontender neck supple with JVP-flat Clear Regular rate and rhythm, no murmurs or gallops Abd-soft with active BS No Clubbing cyanosis edema Skin-warm and dry A & Oriented  Grossly normal sensory and motor function   ECG sinus rhythm at 60 Interval 16/10/42 RSR prime  Assessment and  Plan Atrial fib  PVCs Fractionated   Regular tachypalpitations  RV enlargement by echo/ MRI 2017  Right occiput fullness    palps stable  Will travel with apixoban and flecainide in case he develops  His fullness on R occiput requires evaluation and have called but could not reach his PCP  I told pt he would need followup for this  We spent more than 50% of our >25 min visit in face to face counseling regarding the above     .

## 2017-09-26 ENCOUNTER — Encounter: Payer: Self-pay | Admitting: Internal Medicine

## 2017-09-27 DIAGNOSIS — R220 Localized swelling, mass and lump, head: Secondary | ICD-10-CM | POA: Diagnosis not present

## 2017-10-25 DIAGNOSIS — R220 Localized swelling, mass and lump, head: Secondary | ICD-10-CM | POA: Diagnosis not present

## 2017-11-11 DIAGNOSIS — D170 Benign lipomatous neoplasm of skin and subcutaneous tissue of head, face and neck: Secondary | ICD-10-CM | POA: Diagnosis not present

## 2017-11-11 DIAGNOSIS — R221 Localized swelling, mass and lump, neck: Secondary | ICD-10-CM | POA: Diagnosis not present

## 2017-11-11 DIAGNOSIS — D1739 Benign lipomatous neoplasm of skin and subcutaneous tissue of other sites: Secondary | ICD-10-CM | POA: Diagnosis not present

## 2017-11-23 DIAGNOSIS — Z0000 Encounter for general adult medical examination without abnormal findings: Secondary | ICD-10-CM | POA: Diagnosis not present

## 2017-11-23 DIAGNOSIS — R7309 Other abnormal glucose: Secondary | ICD-10-CM | POA: Diagnosis not present

## 2017-11-23 DIAGNOSIS — Z13220 Encounter for screening for lipoid disorders: Secondary | ICD-10-CM | POA: Diagnosis not present

## 2018-03-14 DIAGNOSIS — N401 Enlarged prostate with lower urinary tract symptoms: Secondary | ICD-10-CM | POA: Diagnosis not present

## 2018-03-14 DIAGNOSIS — R3915 Urgency of urination: Secondary | ICD-10-CM | POA: Diagnosis not present

## 2018-05-11 DIAGNOSIS — N4341 Spermatocele of epididymis, single: Secondary | ICD-10-CM | POA: Diagnosis not present

## 2018-06-27 DIAGNOSIS — L578 Other skin changes due to chronic exposure to nonionizing radiation: Secondary | ICD-10-CM | POA: Diagnosis not present

## 2018-06-27 DIAGNOSIS — L821 Other seborrheic keratosis: Secondary | ICD-10-CM | POA: Diagnosis not present

## 2018-06-27 DIAGNOSIS — L570 Actinic keratosis: Secondary | ICD-10-CM | POA: Diagnosis not present

## 2018-09-26 ENCOUNTER — Telehealth: Payer: Self-pay

## 2018-09-26 NOTE — Telephone Encounter (Signed)
Virtual Visit Pre-Appointment Phone Call  "(Name), I am calling you today to discuss your upcoming appointment. We are currently trying to limit exposure to the virus that causes COVID-19 by seeing patients at home rather than in the office."  1. "What is the BEST phone number to call the day of the visit?" - include this in appointment notes  2. "Do you have or have access to (through a family member/friend) a smartphone with video capability that we can use for your visit?" a. If yes - list this number in appt notes as "cell" (if different from BEST phone #) and list the appointment type as a VIDEO visit in appointment notes b. If no - list the appointment type as a PHONE visit in appointment notes  3. Confirm consent - "In the setting of the current Covid19 crisis, you are scheduled for a (phone or video) visit with your provider on (date) at (time).  Just as we do with many in-office visits, in order for you to participate in this visit, we must obtain consent.  If you'd like, I can send this to your mychart (if signed up) or email for you to review.  Otherwise, I can obtain your verbal consent now.  All virtual visits are billed to your insurance company just like a normal visit would be.  By agreeing to a virtual visit, we'd like you to understand that the technology does not allow for your provider to perform an examination, and thus may limit your provider's ability to fully assess your condition. If your provider identifies any concerns that need to be evaluated in person, we will make arrangements to do so.  Finally, though the technology is pretty good, we cannot assure that it will always work on either your or our end, and in the setting of a video visit, we may have to convert it to a phone-only visit.  In either situation, we cannot ensure that we have a secure connection.  Are you willing to proceed?" STAFF: Did the patient verbally acknowledge consent to telehealth visit? Document  YES/NO here: YES  4. Advise patient to be prepared - "Two hours prior to your appointment, go ahead and check your blood pressure, pulse, oxygen saturation, and your weight (if you have the equipment to check those) and write them all down. When your visit starts, your provider will ask you for this information. If you have an Apple Watch or Kardia device, please plan to have heart rate information ready on the day of your appointment. Please have a pen and paper handy nearby the day of the visit as well."  5. Give patient instructions for MyChart download to smartphone OR [email or web address] as below if video visit (depending on what platform provider is using)  6. Inform patient they will receive a phone call 15 minutes prior to their appointment time (may be from unknown caller ID) so they should be prepared to answer    TELEPHONE CALL NOTE  Kekoa Disbrow has been deemed a candidate for a follow-up tele-health visit to limit community exposure during the Covid-19 pandemic. I spoke with the patient via phone to ensure availability of phone/video source, confirm preferred email & phone number, and discuss instructions and expectations.  I reminded John Ellis to be prepared with any vital sign and/or heart rhythm information that could potentially be obtained via home monitoring, at the time of his visit. I reminded John Ellis to expect a phone call prior to his visit.  Lajoyce Cornersmily R Maddyx Vallie, CMA 09/26/2018 5:04 PM   INSTRUCTIONS FOR DOWNLOADING THE MYCHART APP TO SMARTPHONE  - The patient must first make sure to have activated MyChart and know their login information - If Apple, go to [employer] Store and type in MyChart in the search bar and download the app. If Android, ask patient to go to [employer] Play Store and type in [place] in the search bar and download the app. The app is free but as with any other app downloads, their phone may require them to verify saved payment information or Apple/Android  password.  - The patient will need to then log into the app with their MyChart username and password, and select [place] as their healthcare provider to link the account. When it is time for your visit, go to the MyChart app, find appointments, and click Begin Video Visit. Be sure to Select Allow for your device to access the Microphone and Camera for your visit. You will then be connected, and your provider will be with you shortly.  **If they have any issues connecting, or need assistance please contact MyChart service desk [phone])**  **If using a computer, in order to ensure the best quality for their visit they will need to use either of the following Internet Browsers: [employer] Edge, or Google Chrome**  IF USING DOXIMITY or DOXY.ME - The patient will receive a link just prior to their visit by text.     FULL LENGTH CONSENT FOR TELE-HEALTH VISIT   I hereby voluntarily request, consent and authorize [facility] and its employed or contracted physicians, physician assistants, nurse practitioners or other licensed health care professionals (the Practitioner), to provide me with telemedicine health care services (the "Services") as deemed necessary by the treating Practitioner. I acknowledge and consent to receive the Services by the Practitioner via telemedicine. I understand that the telemedicine visit will involve communicating with the Practitioner through live audiovisual communication technology and the disclosure of certain medical information by electronic transmission. I acknowledge that I have been given the opportunity to request an in-person assessment or other available alternative prior to the telemedicine visit and am voluntarily participating in the telemedicine visit.  I understand that I have the right to withhold or withdraw my consent to the use of telemedicine in the course of my care at any time, without affecting my right to future care or treatment,  and that the Practitioner or I may terminate the telemedicine visit at any time. I understand that I have the right to inspect all information obtained and/or recorded in the course of the telemedicine visit and may receive copies of available information for a reasonable fee.  I understand that some of the potential risks of receiving the Services via telemedicine include:  Marland Kitchen. Delay or interruption in medical evaluation due to technological equipment failure or disruption; . Information transmitted may not be sufficient (e.g. poor resolution of images) to allow for appropriate medical decision making by the Practitioner; and/or  . In rare instances, security protocols could fail, causing a breach of personal health information.  Furthermore, I acknowledge that it is my responsibility to provide information about my medical history, conditions and care that is complete and accurate to the best of my ability. I acknowledge that Practitioner's advice, recommendations, and/or decision may be based on factors not within their control, such as incomplete or inaccurate data provided by me or distortions of diagnostic images or specimens that may result from electronic transmissions. I understand that  the practice of medicine is not an exact science and that Practitioner makes no warranties or guarantees regarding treatment outcomes. I acknowledge that I will receive a copy of this consent concurrently upon execution via email to the email address I last provided but may also request a printed copy by calling the office of [place].    I understand that my insurance will be billed for this visit.   I have read or had this consent read to me. . I understand the contents of this consent, which adequately explains the benefits and risks of the Services being provided via telemedicine.  . I have been provided ample opportunity to ask questions regarding this consent and the Services and have had my questions  answered to my satisfaction. . I give my informed consent for the services to be provided through the use of telemedicine in my medical care  By participating in this telemedicine visit I agree to the above.

## 2018-09-26 NOTE — Telephone Encounter (Signed)
I tried to call patient on both numbers, there was no answer and voicemail was full. I am calling about patients appointment on 10/02/18, we need to switch this to a video visit or phone call.

## 2018-10-02 ENCOUNTER — Ambulatory Visit: Payer: BLUE CROSS/BLUE SHIELD | Admitting: Internal Medicine

## 2018-10-04 ENCOUNTER — Telehealth: Payer: BLUE CROSS/BLUE SHIELD | Admitting: Internal Medicine

## 2018-10-10 ENCOUNTER — Telehealth: Payer: Self-pay

## 2018-10-10 NOTE — Telephone Encounter (Signed)
Left message regarding appt on 10/11/18. 

## 2018-10-10 NOTE — Telephone Encounter (Signed)
Spoke with pt regarding appt on 10/11/18. Pt was advise to check vitals prior to appt. Pt concerns were address.

## 2018-10-10 NOTE — Telephone Encounter (Signed)
Patient called back to confirm appt from tomorrow, he accepted the 8am appt.

## 2018-10-11 ENCOUNTER — Other Ambulatory Visit: Payer: Self-pay

## 2018-10-11 ENCOUNTER — Encounter: Payer: Self-pay | Admitting: Internal Medicine

## 2018-10-11 ENCOUNTER — Telehealth (INDEPENDENT_AMBULATORY_CARE_PROVIDER_SITE_OTHER): Payer: BLUE CROSS/BLUE SHIELD | Admitting: Internal Medicine

## 2018-10-11 VITALS — HR 53 | Ht 74.0 in | Wt 190.0 lb

## 2018-10-11 DIAGNOSIS — I493 Ventricular premature depolarization: Secondary | ICD-10-CM

## 2018-10-11 DIAGNOSIS — I480 Paroxysmal atrial fibrillation: Secondary | ICD-10-CM | POA: Diagnosis not present

## 2018-10-11 NOTE — Progress Notes (Signed)
Electrophysiology TeleHealth Note   Due to national recommendations of social distancing due to COVID 19, an audio/video telehealth visit is felt to be most appropriate for this patient at this time.  See MyChart message from today for the patient's consent to telehealth for [facility].   Date:  10/12/2018   ID:  John Ellis, DOB 03/29/1966, MRN [id number]  Location: patient's home  Provider location: [address], [place]  Evaluation Performed: Follow-up visit  PCP:  Daisy Florooss, Charles Alan, MD  Cardiologist:   *  Electrophysiologist:  SK   Chief Complaint:     History of Present Illness:    John MendsRichard Toro is a 53 y.o. male who presents via audio/video conferencing for a telehealth visit today.  Since last being seen in our clinic for atrial fibrillation-paroxysmal and PVCs  the patient reports one intercurrent afib and lost his job just before COVID  The patient denies chest pain, shortness of breath, nocturnal dyspnea, orthopnea or peripheral edema.  There have been no palpitations, lightheadedness or syncope.     DATE TEST    8/17    Echo   EF normal % RV enlargement  8/17    MRI   EF 65 % Mod dilated RV assoc pectus       TC 211, LDL 117. HDL 86    The patient denies symptoms of fevers, chills, cough, or new SOB worrisome for COVID 19. *  Past Medical History:  Diagnosis Date  . Hemorrhoids   . Keratoconus   . Obstructive sleep apnea   . Paroxysmal atrial fibrillation (HCC)   . Rosacea   . Ventricular premature depolarization     Past Surgical History:  Procedure Laterality Date  . VASECTOMY      Current Outpatient Medications  Medication Sig Dispense Refill  . Cholecalciferol (VITAMIN D-3 PO) Take 5,000 mg by mouth daily.     . Loratadine (CLARITIN PO) Take 10 mg by mouth daily as needed (allergies).     . NON FORMULARY BCAA - Take 3000 mg by mouth daily    . WHEY PROTEIN PO Take 66 g by mouth daily.     No current  facility-administered medications for this visit.     Allergies:   Patient has no known allergies.   Social History:  The patient  reports that he has never smoked. He has never used smokeless tobacco. He reports current alcohol use of about 2.0 standard drinks of alcohol per week. He reports that he does not use drugs.   Family History:  The patient's   family history includes Cancer in his mother and sister.   ROS:  Please see the history of present illness.   All other systems are personally reviewed and negative.    Exam:    Vital Signs:  Pulse (!) 53   Ht 6\' 2"  (1.88 m)   Wt 190 lb (86.2 kg)   BMI 24.39 kg/m     Well appearing, alert and conversant, regular work of breathing,  good skin color Eyes- anicteric, neuro- grossly intact, skin- no apparent rash or lesions or cyanosis, mouth- oral mucosa is pink   Labs/Other Tests and Data Reviewed:    Recent Labs: No results found for requested labs within last 8760 hours.   Wt Readings from Last 3 Encounters:  10/11/18 190 lb (86.2 kg)  09/19/17 194 lb 6.4 oz (88.2 kg)  03/18/17 201 lb (91.2 kg)     Other studies personally  reviewed: Additional studies/ records that were reviewed today include:    Review of the above records today demonstrates:    Prior radiographs:      ASSESSMENT & PLAN:    Atrial fib  PVCs Fractionated   Regular tachypalpitations  RV enlargement by echo/ MRI 2017  Right occiput fullness  Scant afib  Overall doing well  Will need to follow up  RV   COVID 19 screen The patient denies symptoms of COVID 19 at this time.  The importance of social distancing was discussed today.  Follow-up:  69m     Current medicines are reviewed at length with the patient today.   The patient  concerns regarding his medicines.  The following changes were made today:    Labs/ tests ordered today include:   No orders of the defined types were placed in this encounter.      Patient Risk:   after full review of this patients clinical status, I feel that they are at moderate  risk at this time.  Today, I have spent 6* minutes with the patient with telehealth technology discussing the above.  Signed, Sherryl Manges, MD  10/12/2018 5:18 PM     [facility] [address] [place] [address] [phone] (office) [phone] (fax)f

## 2018-12-14 DIAGNOSIS — Z86010 Personal history of colonic polyps: Secondary | ICD-10-CM | POA: Diagnosis not present

## 2018-12-15 ENCOUNTER — Other Ambulatory Visit: Payer: Self-pay

## 2018-12-15 DIAGNOSIS — R5383 Other fatigue: Secondary | ICD-10-CM | POA: Diagnosis not present

## 2018-12-15 DIAGNOSIS — R6889 Other general symptoms and signs: Secondary | ICD-10-CM | POA: Diagnosis not present

## 2018-12-15 DIAGNOSIS — Z20822 Contact with and (suspected) exposure to covid-19: Secondary | ICD-10-CM

## 2018-12-15 DIAGNOSIS — R509 Fever, unspecified: Secondary | ICD-10-CM | POA: Diagnosis not present

## 2018-12-18 LAB — NOVEL CORONAVIRUS, NAA: SARS-CoV-2, NAA: NOT DETECTED

## 2018-12-19 DIAGNOSIS — R5383 Other fatigue: Secondary | ICD-10-CM | POA: Diagnosis not present

## 2018-12-20 ENCOUNTER — Telehealth: Payer: Self-pay | Admitting: Lactation Services

## 2018-12-20 NOTE — Telephone Encounter (Signed)
Pt called and left message on nurse call line at [facility] [employer] re questions about his daughters.   Called pt back and LM for him to call the office back at his convenience.

## 2018-12-22 ENCOUNTER — Telehealth: Payer: Self-pay

## 2018-12-22 NOTE — Telephone Encounter (Signed)
    COVID-19 Pre-Screening Questions:  . In the past 7 to 10 days have you had a cough,  shortness of breath, headache, congestion, fever (100 or greater) body aches, chills, sore throat, or sudden loss of taste or sense of smell? YES for fever 101.2 (last Thursday) Which he got tested for covid 19 and test came back Monday as negative . Have you been around anyone with known Covid 19.NO . Have you been around anyone who is awaiting Covid 19 test results in the past 7 to 10 days? NO . Have you been around anyone who has been exposed to Covid 19, or has mentioned symptoms of Covid 19 within the past 7 to 10 days? NO  If you have any concerns/questions about symptoms patients report during screening (either on the phone or at threshold). Contact the provider seeing the patient or DOD for further guidance.  If neither are available contact a member of the leadership team.

## 2018-12-23 NOTE — Progress Notes (Signed)
Cardiology Office Note Date:  12/25/2018  Patient ID:  John Ellis, DOB December 22, 1965, MRN [id number] PCP:  John Cruel, MD  Cardiologist/Electrophysiologist: Dr. Caryl Ellis    Chief Complaint: Afib during colonoscopy and recommended cardiology visit  History of Present Illness: John Ellis is a 53 y.o. male with history of OSA (he has had a few sleeps studies with variable results, is in the process of getting a dental appliance), paroxysmal AFib, PVCs.  He Ellis in today to be seen for Dr. Caryl Ellis. He last had a tele-health visit with Dr. Caryl Ellis in May, noted that the patient reported one intercurrent afib and lost his job just before [employer].  Otherwise doing well, mentioned need for follow up on RV, no echo ordered (likely 2/2 COVID restrictions) and planned f/u in 1 year.  He is not on a/c with a score of zero, historically note he has been given flecainide and Eliquis when traveling.  DATE TEST    8/17    Echo   EF normal % RV enlargement  8/17    MRI   EF 65 % Mod dilated RV assoc pectus        Dr. Caryl Ellis mentions: (There is no evidence of volume overload i.e. shunts, no evidence of RV myocardial disease, we do have the pectus which could compress and cause it to appear Abnormal. He is also a fit and intense in his training. There is some possibility that represents the consequences of exercise-induced pulmonary hypertension)  HISTORICALLY: Using the AliveCor monitor 1 was clearly atrial fibrillation. Another was associated with PVCs. He has also had regular tachycardia in the past.  No interval atrial fibrillation.  Less PVCs.  Date Holter--PVC %  10/18 5.3        He is feeling very well, exercising regularly without exertional intolerances.  No CP, palpitations or SOB, no dizziness, near syncope or syncope. He was getting his 5 year colonoscopy, was very surprised to here he was in Afib, had no awareness of any kind, and his HR was in the 50's, and his AFib rates are  much faster historically 120's.  He requested a copy of his tracing though they were unable to make or give him one He has checked with his Alive cor periodically through the day every day since without seeing any AF  Past Medical History:  Diagnosis Date  . Hemorrhoids   . Keratoconus   . Obstructive sleep apnea   . Paroxysmal atrial fibrillation (HCC)   . Rosacea   . Ventricular premature depolarization     Past Surgical History:  Procedure Laterality Date  . VASECTOMY      Current Outpatient Medications  Medication Sig Dispense Refill  . Cholecalciferol (VITAMIN D-3 PO) Take 5,000 mg by mouth daily.     . Loratadine (CLARITIN PO) Take 10 mg by mouth daily as needed (allergies).     . NON FORMULARY BCAA - Take 3000 mg by mouth daily    . WHEY PROTEIN PO Take 66 g by mouth daily.     No current facility-administered medications for this visit.     Allergies:   Patient has no known allergies.   Social History:  The patient  reports that he has never smoked. He has never used smokeless tobacco. He reports current alcohol use of about 2.0 standard drinks of alcohol per week. He reports that he does not use drugs.   Family History:  The patient's family history includes Cancer in his mother and  sister.  ROS:  Please see the history of present illness.    All other systems are reviewed and otherwise negative.   PHYSICAL EXAM:  VS:  BP 122/78   Pulse 67   Ht 6\' 2"  (1.88 m)   Wt 192 lb (87.1 kg)   BMI 24.65 kg/m  BMI: Body mass index is 24.65 kg/m. Well nourished, well developed, in no acute distress  HEENT: normocephalic, atraumatic  Neck: no JVD, carotid bruits or masses Cardiac:  RRR; no significant murmurs, no rubs, or gallops Lungs:  CTA b/l, no wheezing, rhonchi or rales  Abd: soft, nontender MS: no deformity or atrophy Ext: no edema  Skin: warm and dry, no rash Neuro:  No gross deficits appreciated Psych: euthymic mood, full affect    EKG:  Done today and  reviewed by myself is SR, normal EKG   03/23/17: 24 hour monitor Dominant Rhythm  Sinus    Mean HR 65 Range HR 39-144 Total beats 96 K PVC 5141 PAC 267 Tachycardia AFib non sustained < 1 min Atrial flutter nonsustained < 1 min Pauses none Symptoms noted palps with above Rate control NA PVC burden is low, only about 5 % so no need to explore echo at this point Does have nonsustained atrial arrhythmias      01/18/26: Exercise stress test (Dr. Donnie Ellis) Clinically and EKG negative for ischemia Excellent exercise tolerance for age (Reacged stage 5, and 15METS)   Recent Labs: No results found for requested labs within last 8760 hours.  No results found for requested labs within last 8760 hours.   CrCl cannot be calculated (Patient's most recent lab result is older than the maximum 21 days allowed.).   Wt Readings from Last 3 Encounters:  12/25/18 192 lb (87.1 kg)  10/11/18 190 lb (86.2 kg)  09/19/17 194 lb 6.4 oz (88.2 kg)     Other studies reviewed: Additional studies/records reviewed today include: summarized above  ASSESSMENT AND PLAN:  1. Paroxysmal Afib     CHA2DS2Vasc remains zero, not on a/c     ? AFib at the time of his colonoscopy     Encouraged to f/u on dental appliance for OSA  2. Enlarged RV by echo/MRI 2017     Will update his echo   Discussed with the patent getting Zio XT to evaluate for any symptomatic AFib and getting his echo updated.  For now, he would like to start with the echo and do spot checks through the day with his Alive Cor, should his echo have changes of suggest need for further evaluation then he would be more agreeable to long term monitoring    Disposition: F/u with Dtr. John Ellis or myself in 72mo, sooner if needed, pending his echo and monitoring from home  Current medicines are reviewed at length with the patient today.  The patient did not have any concerns regarding medicines.  John FredricksonSigned,  , PA-C 12/25/2018 2:28 PM      [facility] [address] [place] [address] [phone] (office)  [phone] (fax)

## 2018-12-25 ENCOUNTER — Ambulatory Visit: Payer: BLUE CROSS/BLUE SHIELD | Admitting: Physician Assistant

## 2018-12-25 ENCOUNTER — Other Ambulatory Visit: Payer: Self-pay

## 2018-12-25 ENCOUNTER — Encounter: Payer: Self-pay | Admitting: Physician Assistant

## 2018-12-25 VITALS — BP 122/78 | HR 67 | Ht 74.0 in | Wt 192.0 lb

## 2018-12-25 DIAGNOSIS — I480 Paroxysmal atrial fibrillation: Secondary | ICD-10-CM | POA: Diagnosis not present

## 2018-12-25 NOTE — Patient Instructions (Signed)
Medication Instructions:  Your physician recommends that you continue on your current medications as directed. Please refer to the Current Medication list given to you today.  If you need a refill on your cardiac medications before your next appointment, please call your pharmacy.   Lab work: NONE ORDERED  TODAY   If you have labs (blood work) drawn today and your tests are completely normal, you will receive your results only by: Marland Kitchen MyChart Message (if you have MyChart) OR . A paper copy in the mail If you have any lab test that is abnormal or we need to change your treatment, we will call you to review the results.  Testing/Procedures: Your physician has requested that you have an echocardiogram. Echocardiography is a painless test that uses sound waves to create images of your heart. It provides your doctor with information about the size and shape of your heart and how well your heart's chambers and valves are working. This procedure takes approximately one hour. There are no restrictions for this procedure.  Follow-Up: At [facility], you and your health needs are our priority.  As part of our continuing mission to provide you with exceptional heart care, we have created designated Provider Care Teams.  These Care Teams include your primary Cardiologist (physician) and Advanced Practice Providers (APPs -  Physician Assistants and Nurse Practitioners) who all work together to provide you with the care you need, when you need it. . You will need a follow up appointment in 3 months.  Tommye Standard, PA-C  Any Other Special Instructions Will Be Listed Below (If Applicable).

## 2019-01-01 ENCOUNTER — Other Ambulatory Visit: Payer: Self-pay

## 2019-01-01 ENCOUNTER — Ambulatory Visit (HOSPITAL_COMMUNITY): Payer: BC Managed Care – PPO | Attending: Cardiology

## 2019-01-01 DIAGNOSIS — R7309 Other abnormal glucose: Secondary | ICD-10-CM | POA: Diagnosis not present

## 2019-01-01 DIAGNOSIS — I480 Paroxysmal atrial fibrillation: Secondary | ICD-10-CM | POA: Insufficient documentation

## 2019-01-01 DIAGNOSIS — Z13220 Encounter for screening for lipoid disorders: Secondary | ICD-10-CM | POA: Diagnosis not present

## 2019-01-04 DIAGNOSIS — Z13220 Encounter for screening for lipoid disorders: Secondary | ICD-10-CM | POA: Diagnosis not present

## 2019-01-04 DIAGNOSIS — R7309 Other abnormal glucose: Secondary | ICD-10-CM | POA: Diagnosis not present

## 2019-01-04 DIAGNOSIS — I480 Paroxysmal atrial fibrillation: Secondary | ICD-10-CM | POA: Diagnosis not present

## 2019-01-23 DIAGNOSIS — Z20828 Contact with and (suspected) exposure to other viral communicable diseases: Secondary | ICD-10-CM | POA: Diagnosis not present

## 2019-02-01 DIAGNOSIS — G4733 Obstructive sleep apnea (adult) (pediatric): Secondary | ICD-10-CM | POA: Diagnosis not present

## 2019-02-26 DIAGNOSIS — S80862A Insect bite (nonvenomous), left lower leg, initial encounter: Secondary | ICD-10-CM | POA: Diagnosis not present

## 2019-03-05 DIAGNOSIS — N401 Enlarged prostate with lower urinary tract symptoms: Secondary | ICD-10-CM | POA: Diagnosis not present

## 2019-03-05 DIAGNOSIS — N4341 Spermatocele of epididymis, single: Secondary | ICD-10-CM | POA: Diagnosis not present

## 2019-03-05 DIAGNOSIS — R351 Nocturia: Secondary | ICD-10-CM | POA: Diagnosis not present

## 2019-03-27 ENCOUNTER — Ambulatory Visit: Payer: BC Managed Care – PPO | Admitting: Physician Assistant

## 2019-03-29 NOTE — Progress Notes (Signed)
Cardiology Office Note Date:  03/30/2019  Patient ID:  John Ellis, DOB 22-Oct-1965, MRN [id number] PCP:  John Floro, MD  Cardiologist/Electrophysiologist: Dr. Graciela Husbands    Chief Complaint: planned f/u  History of Present Illness: John Ellis is a 53 y.o. male with history of OSA (he has had a few sleeps studies with variable results, is in the process of getting a dental appliance), paroxysmal AFib, PVCs.    He last saw Dr .Graciela Husbands via a tele-health visit with Dr. Graciela Husbands in May, noted that the patient reported one intercurrent afib and lost his job just before COVID.  Otherwise doing well, mentioned need for follow up on RV, no echo ordered (likely 2/2 COVID restrictions) and planned f/u in 1 year.  He is not on a/c with a score of zero, historically note he has been given flecainide and Eliquis when traveling.  DATE TEST    8/17    Echo   EF normal % RV enlargement  8/17    MRI   EF 65 % Mod dilated RV assoc pectus        Dr. Graciela Husbands mentions: (There is no evidence of volume overload i.e. shunts, no evidence of RV myocardial disease, we do have the pectus which could compress and cause it to appear Abnormal. He is also a fit and intense in his training. There is some possibility that represents the consequences of exercise-induced pulmonary hypertension)  HISTORICALLY: Using the AliveCor monitor 1 was clearly atrial fibrillation. Another was associated with PVCs. He has also had regular tachycardia in the past.  No interval atrial fibrillation.  Less PVCs.  Date Holter--PVC %  10/18 5.3        I saw him in Aug 2020, he was feeling very well, exercising regularly without exertional intolerances.  No CP, palpitations or SOB, no dizziness, near syncope or syncope. He was getting his 5 year colonoscopy, was very surprised to here he was in Afib, had no awareness of any kind, and his HR was in the 50's, and his AFib rates are much faster historically 120's.  He requested a  copy of his tracing though they were unable to make or give him one He has checked with his Alive cor periodically through the day every day since without seeing any AF Discussed monitoring and getting an echo.  He wanted to hold off on monitor, planned to continue spot checks with his alivecor and update his echo. PMHx updated, CHA2DS2Vasc still zero  Echo noted LVEF 50-55%, Left ventricular diffuse hypokinesis.normal RV cavity was mildly enlarged, systolic function, no significant VHD or findings  He continues to feel quite well, no symptoms of AFib or symptoms of any kind.  He got his dental appliance in Sept and mentions at least by his wife's observation much better.  On the occasion he has gone to sleep without it she noticed right away.  He noticed that he wakes earlier, feels better resting in the mornings. He continues to run no changes to his exertional capacity.    He noted that his dental appliance had turned a yellowish color, the orthodontist told him this likely 2/2 GERD.    Past Medical History:  Diagnosis Date  . Hemorrhoids   . Keratoconus   . Obstructive sleep apnea   . Paroxysmal atrial fibrillation (HCC)   . Rosacea   . Ventricular premature depolarization     Past Surgical History:  Procedure Laterality Date  . VASECTOMY      Current  Outpatient Medications  Medication Sig Dispense Refill  . EPINEPHrine 0.3 mg/0.3 mL IJ SOAJ injection     . Loratadine (CLARITIN PO) Take 10 mg by mouth daily as needed (allergies).     . NON FORMULARY BCAA - Take 3000 mg by mouth daily     No current facility-administered medications for this visit.     Allergies:   Patient has no known allergies.   Social History:  The patient  reports that he has never smoked. He has never used smokeless tobacco. He reports current alcohol use of about 2.0 standard drinks of alcohol per week. He reports that he does not use drugs.   Family History:  The patient's family history includes  Cancer in his mother and sister.  ROS:  Please see the history of present illness.    All other systems are reviewed and otherwise negative.   PHYSICAL EXAM:  VS:  BP 118/82   Pulse 60   Ht 6\' 2"  (1.88 m)   Wt 194 lb (88 kg)   BMI 24.91 kg/m  BMI: Body mass index is 24.91 kg/m. Well nourished, well developed, in no acute distress  HEENT: normocephalic, atraumatic  Neck: no JVD, carotid bruits or masses Cardiac:  RRR; no significant murmurs, no rubs, or gallops Lungs:  CTA b/l, no wheezing, rhonchi or rales  Abd: soft, nontender MS: no deformity or atrophy Ext:  no edema  Skin: warm and dry, no rash Neuro:  No gross deficits appreciated Psych: euthymic mood, full affect    EKG:  Not done today   01/01/2019: TTE IMPRESSIONS 1. The left ventricle has low normal systolic function, with an ejection fraction of 50-55%. The cavity size was normal. Left ventricular diastolic function could not be evaluated due to indeterminate diastolic function. Left ventricular diffuse  hypokinesis.  2. The right ventricle has normal systolic function. The cavity was mildly enlarged. There is no increase in right ventricular wall thickness. Right ventricular systolic pressure could not be assessed.  3. Mild thickening of the anterior mitral valve leaflet with trivial MR.  4. The aortic valve is tricuspid. Mild sclerosis of the aortic valve.  5. The aorta is normal in size and structure.   03/23/17: 24 hour monitor Dominant Rhythm  Sinus    Mean HR 65 Range HR 39-144 Total beats 96 K PVC 5141 PAC 267 Tachycardia AFib non sustained < 1 min Atrial flutter nonsustained < 1 min Pauses none Symptoms noted palps with above Rate control NA PVC burden is low, only about 5 % so no need to explore echo at this point Does have nonsustained atrial arrhythmias      01/18/26: Exercise stress test (Dr. Wynonia Lawman) Clinically and EKG negative for ischemia Excellent exercise tolerance for age (Reacged  stage 5, and 15METS)   Recent Labs: No results found for requested labs within last 8760 hours.  No results found for requested labs within last 8760 hours.   CrCl cannot be calculated (Patient's most recent lab result is older than the maximum 21 days allowed.).   Wt Readings from Last 3 Encounters:  03/30/19 194 lb (88 kg)  12/25/18 192 lb (87.1 kg)  10/11/18 190 lb (86.2 kg)     Other studies reviewed: Additional studies/records reviewed today include: summarized above  ASSESSMENT AND PLAN:  1. Paroxysmal Afib     CHA2DS2Vasc remains zero, not on a/c     No symptoms to suggest recurrence     Using his dental appliance for his  OSA, sleeping better/better rested  2. 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 Donnie Ahoilley in 2017) read 55-60% by reports, after which he had an MRI with EF 65%     No changes      3. ?GERD      He is encouraged to f/u with his PMD   Disposition: we will continue to see him annually, PRN  Current medicines are reviewed at length with the patient today.  The patient did not have any concerns regarding medicines.  Norma FredricksonSigned, Pepper Wyndham, PA-C 03/30/2019 9:25 AM     [facility] [address] [place] [address] [phone] (office)  [phone] (fax)

## 2019-03-30 ENCOUNTER — Other Ambulatory Visit: Payer: Self-pay

## 2019-03-30 ENCOUNTER — Ambulatory Visit: Payer: BC Managed Care – PPO | Admitting: Physician Assistant

## 2019-03-30 VITALS — BP 118/82 | HR 60 | Ht 74.0 in | Wt 194.0 lb

## 2019-03-30 DIAGNOSIS — I480 Paroxysmal atrial fibrillation: Secondary | ICD-10-CM

## 2019-03-30 NOTE — Patient Instructions (Signed)
Medication Instructions:   Your physician recommends that you continue on your current medications as directed. Please refer to the Current Medication list given to you today.  *If you need a refill on your cardiac medications before your next appointment, please call your pharmacy*  Lab Work: [place]   If you have labs (blood work) drawn today and your tests are completely normal, you will receive your results only by: Marland Kitchen MyChart Message (if you have MyChart) OR . A paper copy in the mail If you have any lab test that is abnormal or we need to change your treatment, we will call you to review the results.  Testing/Procedures: NONE ORDERED  TODAY    Follow-Up: At [facility], you and your health needs are our priority.  As part of our continuing mission to provide you with exceptional heart care, we have created designated Provider Care Teams.  These Care Teams include your primary Cardiologist (physician) and Advanced Practice Providers (APPs -  Physician Assistants and Nurse Practitioners) who all work together to provide you with the care you need, when you need it.  Your next appointment:   12 months  The format for your next appointment:   In Person  Provider:   You may see None Dr.Klein  or one of the following Advanced Practice Providers on your designated Care Team:    Chanetta Marshall, NP  Tommye Standard, PA-C  Legrand Como "Jonni Sanger" [place], [place]  CONTACT [facility] [phone] AS NEEDED FOR  ANY CARDIAC RELATED SYMPTOMS     Other Instructions

## 2019-04-27 DIAGNOSIS — U071 COVID-19: Secondary | ICD-10-CM | POA: Diagnosis not present

## 2019-09-11 DIAGNOSIS — L718 Other rosacea: Secondary | ICD-10-CM | POA: Diagnosis not present

## 2019-09-11 DIAGNOSIS — L821 Other seborrheic keratosis: Secondary | ICD-10-CM | POA: Diagnosis not present

## 2019-09-11 DIAGNOSIS — L820 Inflamed seborrheic keratosis: Secondary | ICD-10-CM | POA: Diagnosis not present

## 2019-09-11 DIAGNOSIS — L814 Other melanin hyperpigmentation: Secondary | ICD-10-CM | POA: Diagnosis not present

## 2019-10-10 DIAGNOSIS — Z0000 Encounter for general adult medical examination without abnormal findings: Secondary | ICD-10-CM | POA: Diagnosis not present

## 2019-10-12 DIAGNOSIS — Z13220 Encounter for screening for lipoid disorders: Secondary | ICD-10-CM | POA: Diagnosis not present

## 2019-10-12 DIAGNOSIS — R7309 Other abnormal glucose: Secondary | ICD-10-CM | POA: Diagnosis not present

## 2019-10-12 DIAGNOSIS — Z0000 Encounter for general adult medical examination without abnormal findings: Secondary | ICD-10-CM | POA: Diagnosis not present

## 2019-10-12 DIAGNOSIS — K219 Gastro-esophageal reflux disease without esophagitis: Secondary | ICD-10-CM | POA: Diagnosis not present

## 2020-03-05 DIAGNOSIS — L821 Other seborrheic keratosis: Secondary | ICD-10-CM | POA: Diagnosis not present

## 2020-04-10 NOTE — Progress Notes (Signed)
Cardiology Office Note Date:  04/10/2020  Patient ID:  John Ellis, DOB 1966-03-13, MRN [id number] PCP:  Daisy Floro, MD  Cardiologist/Electrophysiologist: Dr. Graciela Husbands    Chief Complaint: annual f/u  History of Present Illness: John Ellis is a 54 y.o. male with history of OSA (he has had a few sleeps studies with variable results, (has dental appliance), paroxysmal AFib, PVCs.    He last saw Dr .Graciela Husbands via a tele-health visit with Dr. Graciela Husbands in May, noted that the patient reported one intercurrent afib and lost his job just before COVID.  Otherwise doing well, mentioned need for follow up on RV, no echo ordered (likely 2/2 COVID restrictions) and planned f/u in 1 year.  He is not on a/c with a score of zero, historically note he has been given flecainide and Eliquis when traveling.  DATE TEST    8/17    Echo   EF normal % RV enlargement  8/17    MRI   EF 65 % Mod dilated RV assoc pectus        Dr. Graciela Husbands mentions: (There is no evidence of volume overload i.e. shunts, no evidence of RV myocardial disease, we do have the pectus which could compress and cause it to appear Abnormal. He is also a fit and intense in his training. There is some possibility that represents the consequences of exercise-induced pulmonary hypertension)  HISTORICALLY: Using the Alive[employer] monitor 1 was clearly atrial fibrillation. Another was associated with PVCs. He has also had regular tachycardia in the past.  No interval atrial fibrillation.  Less PVCs.  Date Holter--PVC %  10/18 5.3        I saw him in Aug 2020, he was feeling very well, exercising regularly without exertional intolerances.  No CP, palpitations or SOB, no dizziness, near syncope or syncope. He was getting his 5 year colonoscopy, was very surprised to here he was in Afib, had no awareness of any kind, and his HR was in the 50's, and his AFib rates are much faster historically 120's.  He requested a copy of his tracing though  they were unable to make or give him one He has checked with his Alive cor periodically through the day every day since without seeing any AF Discussed monitoring and getting an echo.  He wanted to hold off on monitor, planned to continue spot checks with his alivecor and update his echo. PMHx updated, CHA2DS2Vasc still zero  Echo noted LVEF 50-55%, Left ventricular diffuse hypokinesis.normal RV cavity was mildly enlarged, systolic function, no significant VHD or findings  I saw him Nov 2020 He continues to feel quite well, no symptoms of AFib or symptoms of any kind.  He got his dental appliance in Sept and mentions at least by his wife's observation much better.  On the occasion he has gone to sleep without it she noticed right away.  He noticed that he wakes earlier, feels better resting in the mornings. He continues to run no changes to his exertional capacity.   He noted that his dental appliance had turned a yellowish color, the orthodontist told him this likely 2/2 GERD. No changes were made, recommended he see his PMD for eval/treatmenet of poss GERD.  TODAY Feels like he has done really well until the last few months with an increase in Af burden Feb when at the gym her had an episode that was a few hours, unusually long, and none again until September September developed AFib at dinner one  night, went to bed in Afib, woke back in normal rhythm (this would be a typical event for him) 2 weeks ago another evening episode, though still ongoing when he woke, so he took  A 150mg  dose of flecainide and was back to SR in 15 minutes. Wed this week, at work developed an episode, last about 8 hours.  He is very aware of his AFib, palpitations, very fatigued with it.   Otherwise he is doing quite well, no CP or SOB, no dizzy spells, near syncope or syncope. He was going to the gym every day with excellent exertional capacity and felt great without symptoms. He got a jib in the summer and  started to have to go t the gym at 4AM or so and this got very difficult eventually not working out at all.  He mentions a few years ago with his prior job would need to travel out of the country, Dr. 03-19-1976 gave him (he brings the bottles) metoprolol tart 25mg  daily and flecainide 150mg  (to take 2 for 300mg  total dose)once for Afib episodes. He also gave him some Eliquis to keep incase her was out of the country and developed a persistent episode. He would like to have some kind of strategy in place like this.   He denies any new medical diagnosis', CHA2DS2Vasc then remains zero   Past Medical History:  Diagnosis Date  . Hemorrhoids   . Keratoconus   . Obstructive sleep apnea   . Paroxysmal atrial fibrillation (HCC)   . Rosacea   . Ventricular premature depolarization     Past Surgical History:  Procedure Laterality Date  . VASECTOMY      Current Outpatient Medications  Medication Sig Dispense Refill  . EPINEPHrine 0.3 mg/0.3 mL IJ SOAJ injection     . Loratadine (CLARITIN PO) Take 10 mg by mouth daily as needed (allergies).     . NON FORMULARY BCAA - Take 3000 mg by mouth daily     No current facility-administered medications for this visit.    Allergies:   Patient has no known allergies.   Social History:  The patient  reports that he has never smoked. He has never used smokeless tobacco. He reports current alcohol use of about 2.0 standard drinks of alcohol per week. He reports that he does not use drugs.   Family History:  The patient's family history includes Cancer in his mother and sister.  ROS:  Please see the history of present illness.    All other systems are reviewed and otherwise negative.   PHYSICAL EXAM:  VS:  There were no vitals taken for this visit. BMI: There is no height or weight on file to calculate BMI. Well nourished, well developed, in no acute distress  HEENT: normocephalic, atraumatic  Neck: no JVD, carotid bruits or masses Cardiac:  RRR; no  significant murmurs, no rubs, or gallops Lungs:  CTA b/l, no wheezing, rhonchi or rales  Abd: soft, nontender MS: no deformity or atrophy Ext:  no edema  Skin: warm and dry, no rash Neuro:  No gross deficits appreciated Psych: euthymic mood, full affect    EKG:  Done today and reviewed by myself: SR 68bpm, unchanged   01/01/2019: TTE IMPRESSIONS 1. The left ventricle has low normal systolic function, with an ejection fraction of 50-55%. The cavity size was normal. Left ventricular diastolic function could not be evaluated due to indeterminate diastolic function. Left ventricular diffuse  hypokinesis.  2. The right ventricle has normal systolic  function. The cavity was mildly enlarged. There is no increase in right ventricular wall thickness. Right ventricular systolic pressure could not be assessed.  3. Mild thickening of the anterior mitral valve leaflet with trivial MR.  4. The aortic valve is tricuspid. Mild sclerosis of the aortic valve.  5. The aorta is normal in size and structure.   03/23/17: 24 hour monitor Dominant Rhythm  Sinus    Mean HR 65 Range HR 39-144 Total beats 96 K PVC 5141 PAC 267 Tachycardia AFib non sustained < 1 min Atrial flutter nonsustained < 1 min Pauses none Symptoms noted palps with above Rate control NA PVC burden is low, only about 5 % so no need to explore echo at this point Does have nonsustained atrial arrhythmias     01/12/2016; c.MRI IMPRESSION: 1.  Normal LV size and systolic function, EF 65%.  2. Moderate dilated RV with normal systolic function, EF 53%. The patient has pectus excavatum which leads to some compression on the RV, especially the mid wall and apex. This may have led to alterations in RV volume.  3. No myocardial LGE (RV or LV), so no evidence for infiltrative disease, prior MI, or prior myocarditis.  4.  I do not suspect that ARVC is present.    01/19/16: Exercise stress test (Dr. Donnie Aho) Clinically and  EKG negative for ischemia Excellent exercise tolerance for age (Reacged stage 5, and )   Recent Labs: No results found for requested labs within last 8760 hours.  No results found for requested labs within last 8760 hours.   CrCl cannot be calculated (Patient's most recent lab result is older than the maximum 21 days allowed.).   Wt Readings from Last 3 Encounters:  03/30/19 194 lb (88 kg)  12/25/18 192 lb (87.1 kg)  10/11/18 190 lb (86.2 kg)     Other studies reviewed: Additional studies/records reviewed today include: summarized above  ASSESSMENT AND PLAN:  1. Paroxysmal Afib     CHA2DS2Vasc  remains zero,   not on a/c     Using his dental appliance for his OSA, sleeping better/better rested  Up-tick in AFib burden in the last 3-4 months particularly We discussed at length strategies to manage Daily controlling medication flecainide has worked for him He really prefers not to take a medication every day Discussed pill in the pocket strategy, use flecainide WITH a metoprolol tablet together for AFib episode that is persisting.  He mentions Dr. Kaylyn Lim has told him 48hour window was important and agree, if an episode despite flecainide is ongoing should seek attention sooner rather then later to stay within the cardioversion window.  He would like to figure out a way to get back to regular exercise, he thinks this is the change that has contributed to the increase. Will plan for the same metoprolol/flecainide pill in the pocket strategy.   Disposition: will have him see the AFib clinic in a couple months, Dr. Graciela Husbands in 77mo, sooner if needed.  Current medicines are reviewed at length with the patient today.  The patient did not have any concerns regarding medicines.  [name], PA-C 04/10/2020 6:34 AM     [facility] [address] [place] [address] [phone] (office)  [phone] (fax)

## 2020-04-11 ENCOUNTER — Encounter: Payer: Self-pay | Admitting: Physician Assistant

## 2020-04-11 ENCOUNTER — Other Ambulatory Visit: Payer: Self-pay

## 2020-04-11 ENCOUNTER — Ambulatory Visit (INDEPENDENT_AMBULATORY_CARE_PROVIDER_SITE_OTHER): Payer: BC Managed Care – PPO | Admitting: Physician Assistant

## 2020-04-11 VITALS — BP 120/80 | HR 68 | Ht 74.0 in | Wt 192.0 lb

## 2020-04-11 DIAGNOSIS — I480 Paroxysmal atrial fibrillation: Secondary | ICD-10-CM

## 2020-04-11 MED ORDER — FLECAINIDE ACETATE 150 MG PO TABS: 300.0000 mg | ORAL_TABLET | ORAL | 0 refills | Status: DC | PRN

## 2020-04-11 MED ORDER — METOPROLOL TARTRATE 25 MG PO TABS: 25.0000 mg | ORAL_TABLET | ORAL | 0 refills | Status: DC | PRN

## 2020-04-11 NOTE — Patient Instructions (Signed)
Medication Instructions:   ONLY AS NEEDED  ONCE A DAY MUST TAKE TOGETHER IF HAVE AN AFIB EPISODE:  1. ONE TABLET OF METOPROLOL 25 MG   2.  TWO TABLETS  OF FLECAINIDE (TAMBACOR) TO EQUAL 300 MG   *If you need a refill on your cardiac medications before your next appointment, please call your pharmacy*   Lab Work: NONE ORDERED  TODAY   If you have labs (blood work) drawn today and your tests are completely normal, you will receive your results only by: Marland Kitchen MyChart Message (if you have MyChart) OR . A paper copy in the mail If you have any lab test that is abnormal or we need to change your treatment, we will call you to review the results.   Testing/Procedures:NONE ORDERED  TODAY   Follow-Up: At [facility], you and your health needs are our priority.  As part of our continuing mission to provide you with exceptional heart care, we have created designated Provider Care Teams.  These Care Teams include your primary Cardiologist (physician) and Advanced Practice Providers (APPs -  Physician Assistants and Nurse Practitioners) who all work together to provide you with the care you need, when you need it.  We recommend signing up for the patient portal called "MyChart".  Sign up information is provided on this After Visit Summary.  MyChart is used to connect with patients for Virtual Visits (Telemedicine).  Patients are able to view lab/test results, encounter notes, upcoming appointments, etc.  Non-urgent messages can be sent to your provider as well.   To learn more about what you can do with MyChart, go to [email or web address].    Your next appointment:   2 month(s)  The format for your next appointment:   In Person  Provider:   You will follow up in the Atrial Fibrillation Clinic located at [facility]. Your provider will be: Rudi Coco, NP   Your physician wants you to follow-up in:  IN  6 MONTHS WITH DR Logan Bores will receive a reminder letter in the mail two  months in advance. If you don't receive a letter, please call our office to schedule the follow-up appointment.   Other Instructions

## 2020-05-04 ENCOUNTER — Other Ambulatory Visit: Payer: Self-pay | Admitting: Physician Assistant

## 2020-05-15 DIAGNOSIS — N401 Enlarged prostate with lower urinary tract symptoms: Secondary | ICD-10-CM | POA: Diagnosis not present

## 2020-05-15 DIAGNOSIS — N4341 Spermatocele of epididymis, single: Secondary | ICD-10-CM | POA: Diagnosis not present

## 2020-05-15 DIAGNOSIS — R351 Nocturia: Secondary | ICD-10-CM | POA: Diagnosis not present

## 2020-06-11 ENCOUNTER — Other Ambulatory Visit: Payer: Self-pay

## 2020-06-11 ENCOUNTER — Ambulatory Visit (HOSPITAL_COMMUNITY)
Admission: RE | Admit: 2020-06-11 | Discharge: 2020-06-11 | Disposition: A | Discharge status: Home / Self Care | Payer: BC Managed Care – PPO | Source: Ambulatory Visit | Attending: Nurse Practitioner | Admitting: Nurse Practitioner

## 2020-06-11 ENCOUNTER — Encounter (HOSPITAL_COMMUNITY): Payer: Self-pay | Admitting: Nurse Practitioner

## 2020-06-11 VITALS — BP 120/80 | HR 60 | Wt 193.8 lb

## 2020-06-11 DIAGNOSIS — I358 Other nonrheumatic aortic valve disorders: Secondary | ICD-10-CM | POA: Insufficient documentation

## 2020-06-11 DIAGNOSIS — I480 Paroxysmal atrial fibrillation: Secondary | ICD-10-CM | POA: Diagnosis not present

## 2020-06-11 NOTE — Progress Notes (Signed)
Primary Care Physician: Daisy Floro, MD Referring Physician: Dr. Graciela Husbands Referring provide, Francis Dowse, PA    John Ellis is a 55 y.o. male with a h/o paroxsymal Afib that is ibn the afib clinic for evaluation. He has had paroxysmal  afib for several years but has had very low burden. He recently, since last fall, fell into the pattern of having once a month. He does take metoprolol and flecainide 150 mg simultaneously and the episode  is over usually in a couple of hours. He finds triggers for him are lack  of sleep, dehydration,stress. He has very rare alcohol, no tobacco, uses a mouth appliance thru Dr.Katz  for treatment of sleep apnea. Wife reports that snoring is controlled, He was a very avid exerciser and has gotten out of his routine, trying to fnd the best time to exercise with his job hours. . His BMI is in normal range he really want to take minimal meds for his afib and is not interested in an ablation at this time and I agree since afib burden still seems to be very low.  Today, he denies symptoms of palpitations, chest pain, shortness of breath, orthopnea, PND, lower extremity edema, dizziness, presyncope, syncope, or neurologic sequela. The patient is tolerating medications without difficulties and is otherwise without complaint today.   Past Medical History:  Diagnosis Date  . Hemorrhoids   . Keratoconus   . Obstructive sleep apnea   . Paroxysmal atrial fibrillation (HCC)   . Rosacea   . Ventricular premature depolarization    Past Surgical History:  Procedure Laterality Date  . VASECTOMY      Current Outpatient Medications  Medication Sig Dispense Refill  . b complex vitamins capsule Take 1 capsule by mouth daily.    . BUDESONIDE NA Place 2 sprays into the nose as needed (allergies).    . Cholecalciferol (VITAMIN D3) 1.25 MG (50000 UT) CAPS Take 1 capsule by mouth daily.    Marland Kitchen EPINEPHrine 0.3 mg/0.3 mL IJ SOAJ injection Inject 0.3 mg into the muscle as needed  for anaphylaxis.     . flecainide (TAMBOCOR) 150 MG tablet TAKE 2 TABLETS BY MOUTH AS NEEDED (COMBINED WITH METOPROLOL 25 MG FOR AFIB EPISODES). 60 tablet 11  . levocetirizine (XYZAL) 5 MG tablet Take 5 mg by mouth as needed for allergies.    . metoprolol tartrate (LOPRESSOR) 25 MG tablet TAKE 1 TABLET BY MOUTH AS NEEDED (COMBINED WITH FLECAINDIE 300 MG FOR AFIB EPISODES). 30 tablet 11  . Multiple Vitamin (MULTIVITAMIN) tablet Take 1 tablet by mouth daily.     No current facility-administered medications for this encounter.    No Known Allergies  Social History   Socioeconomic History  . Marital status: Married    Spouse name: Not on file  . Number of children: Not on file  . Years of education: Not on file  . Highest education level: Not on file  Occupational History  . Occupation: Estate manager/land agent  Tobacco Use  . Smoking status: Never Smoker  . Smokeless tobacco: Never Used  Vaping Use  . Vaping Use: Never used  Substance and Sexual Activity  . Alcohol use: Yes    Alcohol/week: 2.0 standard drinks    Types: 1 Cans of beer, 1 Glasses of wine per week    Comment: Weekly.  . Drug use: No  . Sexual activity: Not on file  Other Topics Concern  . Not on file  Social History Narrative  . Not on file  Social Determinants of Health   Financial Resource Strain: Not on file  Food Insecurity: Not on file  Transportation Needs: Not on file  Physical Activity: Not on file  Stress: Not on file  Social Connections: Not on file  Intimate Partner Violence: Not on file    Family History  Problem Relation Age of Onset  . Cancer Mother        Breast,Colon [facility] Squamous Cell Carcinoma)  . Cancer Sister        Thyroid    ROS- All systems are reviewed and negative except as per the HPI above  Physical Exam: Vitals:   06/11/20 0843  BP: 120/80  Pulse: 60  Weight: 87.9 kg   Wt Readings from Last 3 Encounters:  06/11/20 87.9 kg  04/11/20 87.1 kg  03/30/19 88 kg     Labs: Lab Results  Component Value Date   CREATININE 0.90 01/12/2016   No results found for: INR No results found for: CHOL, HDL, LDLCALC, TRIG   GEN- The patient is well appearing, alert and oriented x 3 today.   Head- normocephalic, atraumatic Eyes-  Sclera clear, conjunctiva pink Ears- hearing intact Oropharynx- clear Neck- supple, no JVP Lymph- no cervical lymphadenopathy Lungs- Clear to ausculation bilaterally, normal work of breathing Heart- Regular rate and rhythm, no murmurs, rubs or gallops, PMI not laterally displaced GI- soft, NT, ND, + BS Extremities- no clubbing, cyanosis, or edema MS- no significant deformity or atrophy Skin- no rash or lesion Psych- euthymic mood, full affect Neuro- strength and sensation are intact  EKG-NSR at 60 bpm, pr int 176 ms, qrs int 106 ms, qtc 428 ms  Echo- 1. The left ventricle has low normal systolic function, with an ejection  fraction of 50-55%. The cavity size was normal. Left ventricular diastolic  function could not be evaluated due to indeterminate diastolic function.  Left ventricular diffuse  hypokinesis.  2. The right ventricle has normal systolic function. The cavity was  mildly enlarged. There is no increase in right ventricular wall thickness.  Right ventricular systolic pressure could not be assessed.  3. Mild thickening of the anterior mitral valve leaflet with trivial MR.  4. The aortic valve is tricuspid. Mild sclerosis of the aortic valve.  5. The aorta is normal in size and structure.    Assessment and Plan: 1. Afib Fairly  low burden General eduction re afib and triggers discussed  Continue 150 mg flecainide as PIP but did suggest, trying BB alone for at least 30-45 minutes first, this may be enough to return pt to SR  He wishes to have a minimalist approach to afib and does not want daily meds or ablation at this time He tracks HR's and sleep patterns with an item called OURA ring that reads out to  his phone, does not have capability to run EKG's   2. CHA2DS2VASc score of 0 By guidelines does not need anticoagulation at this time  F/u with Dr. Graciela Husbands as scheduled afib clinic as needed   Lupita Leash C. Matthew Folks [facility] [address] [place], [place] [address] [phone]

## 2020-09-02 ENCOUNTER — Ambulatory Visit (INDEPENDENT_AMBULATORY_CARE_PROVIDER_SITE_OTHER): Payer: BC Managed Care – PPO | Admitting: Internal Medicine

## 2020-09-02 ENCOUNTER — Other Ambulatory Visit: Payer: Self-pay

## 2020-09-02 ENCOUNTER — Encounter: Payer: Self-pay | Admitting: Internal Medicine

## 2020-09-02 VITALS — BP 120/72 | HR 52 | Ht 74.0 in | Wt 191.0 lb

## 2020-09-02 DIAGNOSIS — I480 Paroxysmal atrial fibrillation: Secondary | ICD-10-CM

## 2020-09-02 NOTE — Patient Instructions (Signed)
Medication Instructions:  Your physician recommends that you continue on your current medications as directed. Please refer to the Current Medication list given to you today.  *If you need a refill on your cardiac medications before your next appointment, please call your pharmacy*   Lab Work: None ordered.  If you have labs (blood work) drawn today and your tests are completely normal, you will receive your results only by: Marland Kitchen MyChart Message (if you have MyChart) OR . A paper copy in the mail If you have any lab test that is abnormal or we need to change your treatment, we will call you to review the results.   Testing/Procedures: None ordered.    Follow-Up: At [facility], you and your health needs are our priority.  As part of our continuing mission to provide you with exceptional heart care, we have created designated Provider Care Teams.  These Care Teams include your primary Cardiologist (physician) and Advanced Practice Providers (APPs -  Physician Assistants and Nurse Practitioners) who all work together to provide you with the care you need, when you need it.  We recommend signing up for the patient portal called "MyChart".  Sign up information is provided on this After Visit Summary.  MyChart is used to connect with patients for Virtual Visits (Telemedicine).  Patients are able to view lab/test results, encounter notes, upcoming appointments, etc.  Non-urgent messages can be sent to your provider as well.   To learn more about what you can do with MyChart, go to [email or web address].    Your next appointment:   12 month(s)  The format for your next appointment:   In Person  Provider:   Sherryl Manges, MD   Other Instructions You will be contacted by scheduling for appointment with Dr Johney Frame

## 2020-09-02 NOTE — Progress Notes (Signed)
Patient Care Team: Daisy Floro, MD as PCP - General (Family Medicine)   HPI  John Ellis is a 55 y.o. male seen in follow-up for paroxysmal atrial fibrillation and PVCs.   Using the Alive[employer] monitor 1 was clearly atrial fibrillation. Another was associated with PVCs.   The episode of atrial fibrillation was associated with an extra amount of alcohol intake.  DATE TEST EF   8/17 Echo  Normal % RV enlargement  8/17 MRI   65 % Mod dilated RV assoc pectus  8/20 Echo  50-55%  Normal LV cavity size    (There is no evidence of volume overload i.e. shunts, no evidence of RV myocardial disease, we do have the pectus which could compress and cause it to appear Abnormal. He is also a fit and intense in his training. There is some possibility that represents the consequences of exercise-induced pulmonary hypertension)  Has been seen in the interim by [facility]-PA with an increased burden of interval atrial fibrillation and has used some as needed flecainide. The frequency of his atrial fibrillation has continued to increase as well as the duration over recent months now occurring every couple of days and lasting hours and hours.  He has tried to minimize triggers for his afib by eliminating alcohol, decreasing exercise, and changing jobs to decrease stress  He comes in today however because of a syncopal episode that occurred post micturition.  He had gone to bed and found that he had been in atrial fibrillation and urinating at night found himself on the floor.  Over 10-15 minutes the symptoms resolved.  They were characterized by nausea, sensation of needing to vomit, profound diaphoresis and flushing.  He was unable to stand  He has an antecedent history of syncope and the recovery symptoms were stereotypical.  He has had a stereotypical prodrome in the past.  Date Holter--PVC %  10/18 5.3           Thromboembolic risk factors (----) for a CHADSVASc Score of 0   Past Medical  History:  Diagnosis Date  . Hemorrhoids   . Keratoconus   . Obstructive sleep apnea   . Paroxysmal atrial fibrillation (HCC)   . Rosacea   . Ventricular premature depolarization     Past Surgical History:  Procedure Laterality Date  . VASECTOMY      Current Outpatient Medications  Medication Sig Dispense Refill  . b complex vitamins capsule Take 1 capsule by mouth daily.    . BUDESONIDE NA Place 2 sprays into the nose as needed (allergies).    . Cholecalciferol (VITAMIN D3) 1.25 MG (50000 UT) CAPS Take 1 capsule by mouth daily.    Marland Kitchen EPINEPHrine 0.3 mg/0.3 mL IJ SOAJ injection Inject 0.3 mg into the muscle as needed for anaphylaxis.     . flecainide (TAMBOCOR) 150 MG tablet TAKE 2 TABLETS BY MOUTH AS NEEDED (COMBINED WITH METOPROLOL 25 MG FOR AFIB EPISODES). 60 tablet 11  . levocetirizine (XYZAL) 5 MG tablet Take 5 mg by mouth as needed for allergies.    . metoprolol tartrate (LOPRESSOR) 25 MG tablet TAKE 1 TABLET BY MOUTH AS NEEDED (COMBINED WITH FLECAINDIE 300 MG FOR AFIB EPISODES). 30 tablet 11  . Multiple Vitamin (MULTIVITAMIN) tablet Take 1 tablet by mouth daily.     No current facility-administered medications for this visit.    No Known Allergies    Review of Systems negative except from HPI and PMH  Physical Exam BP 120/72 (  BP Location: Left Arm, Patient Position: Sitting, Cuff Size: Normal)   Pulse (!) 52   Ht 6\' 2"  (1.88 m)   Wt 191 lb (86.6 kg)   SpO2 95%   BMI 24.52 kg/m  Well developed and nourished in no acute distress HENT normal Neck supple with JVP-  flat  Clear Regular rate and rhythm, no murmurs or gallops Abd-soft with active BS No Clubbing cyanosis edema Skin-warm and dry A & Oriented  Grossly normal sensory and motor function  ECG sinus @ 52 15/10/45  RsR'   Assessment and  Plan Atrial fib  PVCs Fractionated   Regular tachypalpitations  RV enlargement by echo/ MRI 2017  Syncope   Increasing burden of atrial fibrillation both in  frequency as well as duration and this despite multiple lifestyle modifications trying to eliminate triggers.  We discussed treatment strategies including antiarrhythmic drugs and catheter ablation.  Given his young age I have suggested a catheter ablation which he is now amenable would be my preferred recommendation.  He has heard much about Dr. 2018 and we will refer him to him  Syncopal episode I suspect is post micturition and neurally mediated with a stereotypical prodrome which he recognizes from other triggers.  We discussed the physiology and the importance of trigger and prodrome recognition and the importance of avoiding the former and with the latter becoming supine so as to try to reduce the likelihood of syncope.  It may well have been that in the context of atrial fibrillation post micturition lightheadedness which he has had previously with sufficient to cause syncope.  Thankfully he was not hurt.  Given the specific trigger I have not recommended not driving      .

## 2020-09-15 DIAGNOSIS — D1801 Hemangioma of skin and subcutaneous tissue: Secondary | ICD-10-CM | POA: Diagnosis not present

## 2020-09-15 DIAGNOSIS — D485 Neoplasm of uncertain behavior of skin: Secondary | ICD-10-CM | POA: Diagnosis not present

## 2020-09-15 DIAGNOSIS — L820 Inflamed seborrheic keratosis: Secondary | ICD-10-CM | POA: Diagnosis not present

## 2020-09-15 DIAGNOSIS — L814 Other melanin hyperpigmentation: Secondary | ICD-10-CM | POA: Diagnosis not present

## 2020-09-15 DIAGNOSIS — D225 Melanocytic nevi of trunk: Secondary | ICD-10-CM | POA: Diagnosis not present

## 2020-09-15 DIAGNOSIS — L821 Other seborrheic keratosis: Secondary | ICD-10-CM | POA: Diagnosis not present

## 2020-09-29 DIAGNOSIS — Z20822 Contact with and (suspected) exposure to covid-19: Secondary | ICD-10-CM | POA: Diagnosis not present

## 2020-10-03 ENCOUNTER — Ambulatory Visit (INDEPENDENT_AMBULATORY_CARE_PROVIDER_SITE_OTHER): Payer: BC Managed Care – PPO | Admitting: Internal Medicine

## 2020-10-03 ENCOUNTER — Encounter: Payer: Self-pay | Admitting: Internal Medicine

## 2020-10-03 ENCOUNTER — Other Ambulatory Visit: Payer: Self-pay

## 2020-10-03 ENCOUNTER — Encounter: Payer: Self-pay | Admitting: *Deleted

## 2020-10-03 VITALS — BP 116/74 | HR 59 | Ht 74.0 in | Wt 192.2 lb

## 2020-10-03 DIAGNOSIS — I480 Paroxysmal atrial fibrillation: Secondary | ICD-10-CM | POA: Diagnosis not present

## 2020-10-03 MED ORDER — RIVAROXABAN 20 MG PO TABS: 20.0000 mg | ORAL_TABLET | Freq: Every day | ORAL | 3 refills | Status: DC

## 2020-10-03 NOTE — Patient Instructions (Addendum)
Medication Instructions:  Start Xarelto 20 mg daily Your physician recommends that you continue on your current medications as directed. Please refer to the Current Medication list given to you today.  Labwork: None ordered.  Testing/Procedures: Your physician has requested that you have a TEE. During a TEE, sound waves are used to create images of your heart. It provides your doctor with information about the size and shape of your heart and how well your heart's chambers and valves are working. In this test, a transducer is attached to the end of a flexible tube that's guided down your throat and into your esophagus (the tube leading from you mouth to your stomach) to get a more detailed image of your heart. You are not awake for the procedure. Please see the instruction sheet given to you today. For further information please visit [email or web address]  Your physician has recommended that you have an ablation. Catheter ablation is a medical procedure used to treat some cardiac arrhythmias (irregular heartbeats). During catheter ablation, a long, thin, flexible tube is put into a blood vessel in your groin (upper thigh), or neck. This tube is called an ablation catheter. It is then guided to your heart through the blood vessel. Radio frequency waves destroy small areas of heart tissue where abnormal heartbeats may cause an arrhythmia to start. Please see the instruction sheet given to you today.  Any Other Special Instructions Will Be Listed Below (If Applicable).  If you need a refill on your cardiac medications before your next appointment, please call your pharmacy.    Cardiac Ablation Cardiac ablation is a procedure to destroy (ablate) some heart tissue that is sending bad signals. These bad signals cause problems in heart rhythm. The heart has many areas that make these signals. If there are problems in these areas, they can make the heart beat in a way that is not normal. Destroying some  tissues can help make the heart rhythm normal. Tell your doctor about:  Any allergies you have.  All medicines you are taking. These include vitamins, herbs, eye drops, creams, and over-the-counter medicines.  Any problems you or family members have had with medicines that make you fall asleep (anesthetics).  Any blood disorders you have.  Any surgeries you have had.  Any medical conditions you have, such as kidney failure.  Whether you are pregnant or may be pregnant. What are the risks? This is a safe procedure. But problems may occur, including:  Infection.  Bruising and bleeding.  Bleeding into the chest.  Stroke or blood clots.  Damage to nearby areas of your body.  Allergies to medicines or dyes.  The need for a pacemaker if the normal system is damaged.  Failure of the procedure to treat the problem. What happens before the procedure? Medicines Ask your doctor about:  Changing or stopping your normal medicines. This is important.  Taking aspirin and ibuprofen. Do not take these medicines unless your doctor tells you to take them.  Taking other medicines, vitamins, herbs, and supplements. General instructions  Follow instructions from your doctor about what you cannot eat or drink.  Plan to have someone take you home from the hospital or clinic.  If you will be going home right after the procedure, plan to have someone with you for 24 hours.  Ask your doctor what steps will be taken to prevent infection. What happens during the procedure?  An IV tube will be put into one of your veins.  You will be given  a medicine to help you relax.  The skin on your neck or groin will be numbed.  A cut (incision) will be made in your neck or groin. A needle will be put through your cut and into a large vein.  A tube (catheter) will be put into the needle. The tube will be moved to your heart.  Dye may be put through the tube. This helps your doctor see your  heart.  Small devices (electrodes) on the tube will send out signals.  A type of energy will be used to destroy some heart tissue.  The tube will be taken out.  Pressure will be held on your cut. This helps stop bleeding.  A bandage will be put over your cut. The exact procedure may vary among doctors and hospitals.   What happens after the procedure?  You will be watched until you leave the hospital or clinic. This includes checking your heart rate, breathing rate, oxygen, and blood pressure.  Your cut will be watched for bleeding. You will need to lie still for a few hours.  Do not drive for 24 hours or as long as your doctor tells you. Summary  Cardiac ablation is a procedure to destroy some heart tissue. This is done to treat heart rhythm problems.  Tell your doctor about any medical conditions you may have. Tell him or her about all medicines you are taking to treat them.  This is a safe procedure. But problems may occur. These include infection, bruising, bleeding, and damage to nearby areas of your body.  Follow what your doctor tells you about food and drink. You may also be told to change or stop some of your medicines.  After the procedure, do not drive for 24 hours or as long as your doctor tells you. This information is not intended to replace advice given to you by your health care provider. Make sure you discuss any questions you have with your health care provider. Document Revised: 04/12/2019 Document Reviewed: 04/12/2019 Elsevier Patient Education  2021 [employer].

## 2020-10-03 NOTE — Progress Notes (Signed)
Electrophysiology Office Note   Date:  10/03/2020   ID:  John Ellis, DOB 04-09-1966, MRN [id number]  PCP:  Daisy Floro, MD    Primary Electrophysiologist: Dr Graciela Husbands  CC: afib   History of Present Illness: John Ellis is a 55 y.o. male who presents today for electrophysiology evaluation.   He presents to discuss options for his afib. He has had afib for 4-5 years.   Episodes have increased in frequency and duration since that time.  He now has episodes weekly.  + palpitations and fatigue.   Today, he denies symptoms of palpitations, chest pain, shortness of breath, orthopnea, PND, lower extremity edema, claudication, dizziness, presyncope, syncope, bleeding, or neurologic sequela. The patient is tolerating medications without difficulties and is otherwise without complaint today.    Past Medical History:  Diagnosis Date  . Hemorrhoids   . Keratoconus   . Obstructive sleep apnea   . Paroxysmal atrial fibrillation (HCC)   . Rosacea   . Ventricular premature depolarization    Past Surgical History:  Procedure Laterality Date  . VASECTOMY       Current Outpatient Medications  Medication Sig Dispense Refill  . b complex vitamins capsule Take 1 capsule by mouth daily.    . BUDESONIDE NA Place 2 sprays into the nose as needed (allergies).    . Cholecalciferol (VITAMIN D3) 1.25 MG (50000 UT) CAPS Take 1 capsule by mouth daily.    Marland Kitchen EPINEPHrine 0.3 mg/0.3 mL IJ SOAJ injection Inject 0.3 mg into the muscle as needed for anaphylaxis.     . flecainide (TAMBOCOR) 150 MG tablet TAKE 2 TABLETS BY MOUTH AS NEEDED (COMBINED WITH METOPROLOL 25 MG FOR AFIB EPISODES). 60 tablet 11  . levocetirizine (XYZAL) 5 MG tablet Take 5 mg by mouth as needed for allergies.    . metoprolol tartrate (LOPRESSOR) 25 MG tablet TAKE 1 TABLET BY MOUTH AS NEEDED (COMBINED WITH FLECAINDIE 300 MG FOR AFIB EPISODES). 30 tablet 11  . Multiple Vitamin (MULTIVITAMIN) tablet Take 1 tablet by mouth daily.      No current facility-administered medications for this visit.    Allergies:   Patient has no known allergies.   Social History:  The patient  reports that he has never smoked. He has never used smokeless tobacco. He reports current alcohol use of about 2.0 standard drinks of alcohol per week. He reports that he does not use drugs.   Family History:  The patient's  family history includes Cancer in his mother and sister.    ROS:  Please see the history of present illness.   All other systems are personally reviewed and negative.    PHYSICAL EXAM: VS:  BP 116/74   Pulse (!) 59   Ht 6\' 2"  (1.88 m)   Wt 192 lb 3.2 oz (87.2 kg)   SpO2 98%   BMI 24.68 kg/m  , BMI Body mass index is 24.68 kg/m. GEN: Well nourished, well developed, in no acute distress HEENT: normal Neck: no JVD, carotid bruits, or masses Cardiac: RRR; no murmurs, rubs, or gallops,no edema  Respiratory:  clear to auscultation bilaterally, normal work of breathing GI: soft, nontender, nondistended, + BS MS: no deformity or atrophy Skin: warm and dry  Neuro:  Strength and sensation are intact Psych: euthymic mood, full affect  EKG:  EKG is ordered today. The ekg ordered today is personally reviewed and shows sinus rhythm   Recent Labs: No results found for requested labs within last 8760 hours.  personally reviewed   Lipid Panel  No results found for: CHOL, TRIG, HDL, CHOLHDL, VLDL, LDLCALC, LDLDIRECT personally reviewed   Wt Readings from Last 3 Encounters:  10/03/20 192 lb 3.2 oz (87.2 kg)  09/02/20 191 lb (86.6 kg)  06/11/20 193 lb 12.8 oz (87.9 kg)      Other studies personally reviewed: Additional studies/ records that were reviewed today include: prior echo,  Dr Koren Bound records  Review of the above records today demonstrates: as above   ASSESSMENT AND PLAN:  1.  Paroxysmal atrial fibrillation The patient has symptomatic, recurrent  atrial fibrillation.   Chads2vasc score is 0.  We will  start xarelto 20mg  daily today Therapeutic strategies for afib including medicine (daily flecainide, tikosyn) and ablation were discussed in detail with the patient today. Risk, benefits, and alternatives to EP study and radiofrequency ablation for afib were also discussed in detail today. These risks include but are not limited to stroke, bleeding, vascular damage, tamponade, perforation, damage to the esophagus, lungs, and other structures, pulmonary vein stenosis, worsening renal function, and death. The patient understands these risk and wishes to proceed.  We will therefore proceed with catheter ablation at the next available time.  Carto, ICE, anesthesia are requested for the procedure.  Will also obtain TEE prior to the procedure to exclude LAA thrombus and further evaluate atrial anatomy.   , MD  10/03/2020 1:00 PM     [facility] [address] [place] [phone] (office) [phone] (fax)

## 2020-10-07 ENCOUNTER — Ambulatory Visit: Payer: BC Managed Care – PPO | Admitting: Internal Medicine

## 2020-10-09 DIAGNOSIS — R7309 Other abnormal glucose: Secondary | ICD-10-CM | POA: Diagnosis not present

## 2020-10-09 DIAGNOSIS — Z0000 Encounter for general adult medical examination without abnormal findings: Secondary | ICD-10-CM | POA: Diagnosis not present

## 2020-10-09 DIAGNOSIS — Z13220 Encounter for screening for lipoid disorders: Secondary | ICD-10-CM | POA: Diagnosis not present

## 2020-10-11 DIAGNOSIS — Z20828 Contact with and (suspected) exposure to other viral communicable diseases: Secondary | ICD-10-CM | POA: Diagnosis not present

## 2020-10-11 DIAGNOSIS — J019 Acute sinusitis, unspecified: Secondary | ICD-10-CM | POA: Diagnosis not present

## 2020-10-13 DIAGNOSIS — Z23 Encounter for immunization: Secondary | ICD-10-CM | POA: Diagnosis not present

## 2020-10-13 DIAGNOSIS — Z0000 Encounter for general adult medical examination without abnormal findings: Secondary | ICD-10-CM | POA: Diagnosis not present

## 2020-10-31 DIAGNOSIS — M25512 Pain in left shoulder: Secondary | ICD-10-CM | POA: Diagnosis not present

## 2020-11-10 ENCOUNTER — Other Ambulatory Visit (HOSPITAL_COMMUNITY)
Admission: RE | Admit: 2020-11-10 | Discharge: 2020-11-10 | Disposition: A | Discharge status: Home / Self Care | Payer: BC Managed Care – PPO | Source: Ambulatory Visit | Attending: Cardiology | Admitting: Cardiology

## 2020-11-10 NOTE — Pre-Procedure Instructions (Signed)
Instructed patient on the following items: Arrival time 0800, TEE scheduled @ 0900 Nothing to eat or drink after midnight No meds AM of procedure Responsible person to drive you home and stay with you for 24 hrs  Have you missed any doses of anti-coagulant Xarelto- hasn't missed any doses

## 2020-11-11 ENCOUNTER — Ambulatory Visit (HOSPITAL_COMMUNITY): Payer: BC Managed Care – PPO | Admitting: Certified Registered Nurse Anesthetist

## 2020-11-11 ENCOUNTER — Ambulatory Visit (HOSPITAL_BASED_OUTPATIENT_CLINIC_OR_DEPARTMENT_OTHER)
Admission: RE | Admit: 2020-11-11 | Discharge: 2020-11-11 | Disposition: A | Discharge status: Home / Self Care | Payer: BC Managed Care – PPO | Source: Ambulatory Visit | Attending: Cardiology | Admitting: Cardiology

## 2020-11-11 ENCOUNTER — Ambulatory Visit (HOSPITAL_COMMUNITY)
Admission: RE | Admit: 2020-11-11 | Discharge: 2020-11-11 | Disposition: A | Discharge status: Home / Self Care | Payer: BC Managed Care – PPO | Attending: Cardiology | Admitting: Cardiology

## 2020-11-11 ENCOUNTER — Ambulatory Visit (HOSPITAL_COMMUNITY): Payer: BC Managed Care – PPO | Admitting: Certified Registered"

## 2020-11-11 ENCOUNTER — Encounter (HOSPITAL_COMMUNITY)
Admission: RE | Disposition: A | Discharge status: Home / Self Care | Payer: Self-pay | Source: Home / Self Care | Attending: Cardiology

## 2020-11-11 ENCOUNTER — Ambulatory Visit (HOSPITAL_COMMUNITY)
Admission: RE | Disposition: A | Discharge status: Home / Self Care | Payer: Self-pay | Source: Home / Self Care | Attending: Cardiology

## 2020-11-11 ENCOUNTER — Encounter (HOSPITAL_COMMUNITY): Payer: Self-pay | Admitting: Cardiology

## 2020-11-11 ENCOUNTER — Other Ambulatory Visit: Payer: Self-pay

## 2020-11-11 DIAGNOSIS — I081 Rheumatic disorders of both mitral and tricuspid valves: Secondary | ICD-10-CM | POA: Diagnosis not present

## 2020-11-11 DIAGNOSIS — G4733 Obstructive sleep apnea (adult) (pediatric): Secondary | ICD-10-CM | POA: Diagnosis not present

## 2020-11-11 DIAGNOSIS — G4730 Sleep apnea, unspecified: Secondary | ICD-10-CM | POA: Insufficient documentation

## 2020-11-11 DIAGNOSIS — I4891 Unspecified atrial fibrillation: Secondary | ICD-10-CM

## 2020-11-11 DIAGNOSIS — I480 Paroxysmal atrial fibrillation: Secondary | ICD-10-CM | POA: Insufficient documentation

## 2020-11-11 DIAGNOSIS — Z9989 Dependence on other enabling machines and devices: Secondary | ICD-10-CM | POA: Diagnosis not present

## 2020-11-11 DIAGNOSIS — I517 Cardiomegaly: Secondary | ICD-10-CM | POA: Insufficient documentation

## 2020-11-11 DIAGNOSIS — I493 Ventricular premature depolarization: Secondary | ICD-10-CM | POA: Diagnosis not present

## 2020-11-11 HISTORY — PX: ATRIAL FIBRILLATION ABLATION: EP1191

## 2020-11-11 HISTORY — PX: TEE WITHOUT CARDIOVERSION: SHX5443

## 2020-11-11 HISTORY — PX: BUBBLE STUDY: SHX6837

## 2020-11-11 LAB — BASIC METABOLIC PANEL
Anion gap: 8 (ref 5–15)
BUN: 13 mg/dL (ref 6–20)
CO2: 28 mmol/L (ref 22–32)
Calcium: 9.5 mg/dL (ref 8.9–10.3)
Chloride: 102 mmol/L (ref 98–111)
Creatinine, Ser: 0.81 mg/dL (ref 0.61–1.24)
GFR, Estimated: >60 mL/min (ref >60)
Glucose, Bld: 107 mg/dL — ABNORMAL HIGH (ref 70–99)
Potassium: 3.8 mmol/L (ref 3.5–5.1)
Sodium: 138 mmol/L (ref 135–145)

## 2020-11-11 LAB — CBC
HCT: 43.8 % (ref 39.0–52.0)
Hemoglobin: 14.8 g/dL (ref 13.0–17.0)
MCH: 31.6 pg (ref 26.0–34.0)
MCHC: 33.8 g/dL (ref 30.0–36.0)
MCV: 93.6 fL (ref 80.0–100.0)
Platelets: 182 10*3/uL (ref 150–400)
RBC: 4.68 MIL/uL (ref 4.22–5.81)
RDW: 11.8 % (ref 11.5–15.5)
WBC: 3.7 10*3/uL — ABNORMAL LOW (ref 4.0–10.5)
nRBC: 0 % (ref 0.0–0.2)

## 2020-11-11 LAB — POCT ACTIVATED CLOTTING TIME: Activated Clotting Time: 289 seconds

## 2020-11-11 SURGERY — ECHOCARDIOGRAM, TRANSESOPHAGEAL
Anesthesia: Monitor Anesthesia Care

## 2020-11-11 SURGERY — ATRIAL FIBRILLATION ABLATION
Anesthesia: General

## 2020-11-11 MED ORDER — SODIUM CHLORIDE 0.9% FLUSH: 3.0000 mL | INTRAVENOUS | Status: DC | PRN

## 2020-11-11 MED ORDER — SODIUM CHLORIDE 0.9 % IV SOLN: 250.0000 mL | INTRAVENOUS | Status: DC | PRN

## 2020-11-11 MED ORDER — EPHEDRINE SULFATE-NACL 50-0.9 MG/10ML-% IV SOSY
PREFILLED_SYRINGE | INTRAVENOUS | Status: DC | PRN
  Administered 2020-11-11 (×2): 5 mg via INTRAVENOUS

## 2020-11-11 MED ORDER — PHENYLEPHRINE 40 MCG/ML (10ML) SYRINGE FOR IV PUSH (FOR BLOOD PRESSURE SUPPORT)
PREFILLED_SYRINGE | INTRAVENOUS | Status: DC | PRN
  Administered 2020-11-11: 80 ug via INTRAVENOUS
  Administered 2020-11-11: 40 ug via INTRAVENOUS

## 2020-11-11 MED ORDER — HEPARIN SODIUM (PORCINE) 1000 UNIT/ML IJ SOLN
INTRAMUSCULAR | Status: AC
  Filled 2020-11-11: qty 1

## 2020-11-11 MED ORDER — SUGAMMADEX SODIUM 200 MG/2ML IV SOLN
INTRAVENOUS | Status: DC | PRN
  Administered 2020-11-11: 175 mg via INTRAVENOUS

## 2020-11-11 MED ORDER — PROPOFOL 10 MG/ML IV BOLUS
INTRAVENOUS | Status: DC | PRN
  Administered 2020-11-11: 150 mg via INTRAVENOUS

## 2020-11-11 MED ORDER — PROTAMINE SULFATE 10 MG/ML IV SOLN
INTRAVENOUS | Status: DC | PRN
  Administered 2020-11-11: 40 mg via INTRAVENOUS

## 2020-11-11 MED ORDER — DEXAMETHASONE SODIUM PHOSPHATE 10 MG/ML IJ SOLN
INTRAMUSCULAR | Status: DC | PRN
  Administered 2020-11-11: 10 mg via INTRAVENOUS

## 2020-11-11 MED ORDER — ONDANSETRON HCL 4 MG/2ML IJ SOLN: 4.0000 mg | Freq: Four times a day (QID) | INTRAMUSCULAR | Status: DC | PRN

## 2020-11-11 MED ORDER — ISOPROTERENOL HCL 0.2 MG/ML IJ SOLN
INTRAVENOUS | Status: DC | PRN
  Administered 2020-11-11: 20 ug/min via INTRAVENOUS

## 2020-11-11 MED ORDER — SODIUM CHLORIDE 0.9 % IV SOLN: INTRAVENOUS | Status: DC

## 2020-11-11 MED ORDER — ACETAMINOPHEN 325 MG PO TABS: 650.0000 mg | ORAL_TABLET | ORAL | Status: DC | PRN

## 2020-11-11 MED ORDER — HEPARIN SODIUM (PORCINE) 1000 UNIT/ML IJ SOLN
INTRAMUSCULAR | Status: AC
  Filled 2020-11-11: qty 2

## 2020-11-11 MED ORDER — HYDROCODONE-ACETAMINOPHEN 5-325 MG PO TABS: 1.0000 | ORAL_TABLET | ORAL | Status: DC | PRN

## 2020-11-11 MED ORDER — ISOPROTERENOL HCL 0.2 MG/ML IJ SOLN
INTRAMUSCULAR | Status: AC
  Filled 2020-11-11: qty 5

## 2020-11-11 MED ORDER — MIDAZOLAM HCL 5 MG/5ML IJ SOLN
INTRAMUSCULAR | Status: DC | PRN
  Administered 2020-11-11: 1 mg via INTRAVENOUS

## 2020-11-11 MED ORDER — LIDOCAINE 2% (20 MG/ML) 5 ML SYRINGE
INTRAMUSCULAR | Status: DC | PRN
  Administered 2020-11-11: 40 mg via INTRAVENOUS

## 2020-11-11 MED ORDER — HEPARIN SODIUM (PORCINE) 1000 UNIT/ML IJ SOLN
INTRAMUSCULAR | Status: DC | PRN
Start: 2020-11-11 — End: 2020-11-11
  Administered 2020-11-11: 1000 [IU] via INTRAVENOUS
  Administered 2020-11-11: 15000 [IU] via INTRAVENOUS

## 2020-11-11 MED ORDER — ROCURONIUM BROMIDE 100 MG/10ML IV SOLN
INTRAVENOUS | Status: DC | PRN
  Administered 2020-11-11: 60 mg via INTRAVENOUS
  Administered 2020-11-11 (×2): 20 mg via INTRAVENOUS

## 2020-11-11 MED ORDER — GLYCOPYRROLATE 0.2 MG/ML IJ SOLN
INTRAMUSCULAR | Status: DC | PRN
  Administered 2020-11-11 (×2): .1 mg via INTRAVENOUS

## 2020-11-11 MED ORDER — SODIUM CHLORIDE 0.9 % IV SOLN: INTRAVENOUS | Status: DC | PRN

## 2020-11-11 MED ORDER — HEPARIN SODIUM (PORCINE) 1000 UNIT/ML IJ SOLN
INTRAMUSCULAR | Status: DC | PRN
  Administered 2020-11-11: 3000 [IU] via INTRAVENOUS

## 2020-11-11 MED ORDER — ONDANSETRON HCL 4 MG/2ML IJ SOLN
INTRAMUSCULAR | Status: DC | PRN
  Administered 2020-11-11: 4 mg via INTRAVENOUS

## 2020-11-11 MED ORDER — SODIUM CHLORIDE 0.9% FLUSH
3.0000 mL | Freq: Two times a day (BID) | INTRAVENOUS | Status: DC
  Administered 2020-11-11: 3 mL via INTRAVENOUS

## 2020-11-11 MED ORDER — PROPOFOL 500 MG/50ML IV EMUL
INTRAVENOUS | Status: DC | PRN
  Administered 2020-11-11: 125 ug/kg/min via INTRAVENOUS

## 2020-11-11 MED ORDER — LACTATED RINGERS IV SOLN: INTRAVENOUS | Status: DC

## 2020-11-11 MED ORDER — PANTOPRAZOLE SODIUM 40 MG PO TBEC: 40.0000 mg | DELAYED_RELEASE_TABLET | Freq: Every day | ORAL | 0 refills | Status: DC

## 2020-11-11 MED ORDER — HEPARIN (PORCINE) IN NACL 1000-0.9 UT/500ML-% IV SOLN
INTRAVENOUS | Status: DC | PRN
  Administered 2020-11-11: 500 mL

## 2020-11-11 MED ORDER — FENTANYL CITRATE (PF) 100 MCG/2ML IJ SOLN
INTRAMUSCULAR | Status: DC | PRN
  Administered 2020-11-11 (×2): 50 ug via INTRAVENOUS

## 2020-11-11 MED ORDER — PROPOFOL 10 MG/ML IV BOLUS
INTRAVENOUS | Status: DC | PRN
  Administered 2020-11-11: 30 mg via INTRAVENOUS
  Administered 2020-11-11: 20 mg via INTRAVENOUS
  Administered 2020-11-11: 50 mg via INTRAVENOUS

## 2020-11-11 SURGICAL SUPPLY — 21 items
BLANKET WARM UNDERBOD FULL ACC (MISCELLANEOUS) ×2 IMPLANT
CATH 8FR REPROCESSED SOUNDSTAR (CATHETERS) ×2 IMPLANT
CATH 8FR SOUNDSTAR REPROCESSED (CATHETERS) IMPLANT
CATH MAPPNG PENTARAY F 2-6-2MM (CATHETERS) IMPLANT
CATH SMTCH THERMOCOOL SF DF (CATHETERS) ×1 IMPLANT
CATH WEBSTER BI DIR CS D-F CRV (CATHETERS) ×1 IMPLANT
CLOSURE PERCLOSE PROSTYLE (VASCULAR PRODUCTS) ×4 IMPLANT
COVER SWIFTLINK CONNECTOR (BAG) ×2 IMPLANT
MAT PREVALON FULL STRYKER (MISCELLANEOUS) ×1 IMPLANT
NDL BAYLIS TRANSSEPTAL 71CM (NEEDLE) IMPLANT
NEEDLE BAYLIS TRANSSEPTAL 71CM (NEEDLE) ×2 IMPLANT
PACK EP LATEX FREE (CUSTOM PROCEDURE TRAY) ×2
PACK EP LF (CUSTOM PROCEDURE TRAY) ×1 IMPLANT
PAD PRO RADIOLUCENT 2001M-C (PAD) ×2 IMPLANT
PATCH CARTO3 (PAD) ×1 IMPLANT
PENTARAY F 2-6-2MM (CATHETERS) ×2
SHEATH PINNACLE 7F 10CM (SHEATH) ×2 IMPLANT
SHEATH PINNACLE 9F 10CM (SHEATH) ×1 IMPLANT
SHEATH PROBE COVER 6X72 (BAG) ×1 IMPLANT
SHEATH SWARTZ TS SL2 63CM 8.5F (SHEATH) ×1 IMPLANT
TUBING SMART ABLATE COOLFLOW (TUBING) ×1 IMPLANT

## 2020-11-11 NOTE — Progress Notes (Signed)
  Echocardiogram Echocardiogram Transesophageal has been performed.  Pieter Partridge 11/11/2020, 10:04 AM

## 2020-11-11 NOTE — Discharge Instructions (Addendum)
Cardiac Ablation, Care After  This sheet gives you information about how to care for yourself after your procedure. Your health care provider may also give you more specific instructions. If you have problems or questions, contact your health care provider. What can I expect after the procedure? After the procedure, it is common to have: Bruising around your puncture site. Tenderness around your puncture site. Skipped heartbeats. Tiredness (fatigue).  Follow these instructions at home: Puncture site care  Follow instructions from your health care provider about how to take care of your puncture site. Make sure you: If present, leave stitches (sutures), skin glue, or adhesive strips in place. These skin closures may need to stay in place for up to 2 weeks. If adhesive strip edges start to loosen and curl up, you may trim the loose edges. Do not remove adhesive strips completely unless your health care provider tells you to do that. If a large square bandage is present, this may be removed 24 hours after surgery.  Check your puncture site every day for signs of infection. Check for: Redness, swelling, or pain. Fluid or blood. If your puncture site starts to bleed, lie down on your back, apply firm pressure to the area, and contact your health care provider. Warmth. Pus or a bad smell. Driving Do not drive for at least 4 days after your procedure or however long your health care provider recommends. (Do not resume driving if you have previously been instructed not to drive for other health reasons.) Do not drive or use heavy machinery while taking prescription pain medicine. Activity Avoid activities that take a lot of effort for at least 7 days after your procedure. Do not lift anything that is heavier than 5 lb (4.5 kg) for one week.  No sexual activity for 1 week.  Return to your normal activities as told by your health care provider. Ask your health care provider what activities are safe  for you. General instructions Take over-the-counter and prescription medicines only as told by your health care provider. Do not use any products that contain nicotine or tobacco, such as cigarettes and e-cigarettes. If you need help quitting, ask your health care provider. You may shower after 24 hours, but Do not take baths, swim, or use a hot tub for 1 week.  Do not drink alcohol for 24 hours after your procedure. Keep all follow-up visits as told by your health care provider. This is important. Contact a health care provider if: You have redness, mild swelling, or pain around your puncture site. You have fluid or blood coming from your puncture site that stops after applying firm pressure to the area. Your puncture site feels warm to the touch. You have pus or a bad smell coming from your puncture site. You have a fever. You have chest pain or discomfort that spreads to your neck, jaw, or arm. You are sweating a lot. You feel nauseous. You have a fast or irregular heartbeat. You have shortness of breath. You are dizzy or light-headed and feel the need to lie down. You have pain or numbness in the arm or leg closest to your puncture site. Get help right away if: Your puncture site suddenly swells. Your puncture site is bleeding and the bleeding does not stop after applying firm pressure to the area. These symptoms may represent a serious problem that is an emergency. Do not wait to see if the symptoms will go away. Get medical help right away. Call your local emergency   services (911 in the [place] Do not drive yourself to the hospital. Summary After the procedure, it is normal to have bruising and tenderness at the puncture site in your groin, neck, or forearm. Check your puncture site every day for signs of infection. Get help right away if your puncture site is bleeding and the bleeding does not stop after applying firm pressure to the area. This is a medical emergency. This  information is not intended to replace advice given to you by your health care provider. Make sure you discuss any questions you have with your health care provider.    Post procedure care instructions No driving for 4 days. No lifting over 5 lbs for 1 week. No vigorous or sexual activity for 1 week. You may return to work/your usual activities on 11/19/20. Keep procedure site clean & dry. If you notice increased pain, swelling, bleeding or pus, call/return!  You may shower after 24 hours, but no soaking in baths/hot tubs/pools for 1 week.    You have an appointment set up with the Atrial Fibrillation Clinic.  Multiple studies have shown that being followed by a dedicated atrial fibrillation clinic in addition to the standard care you receive from your other physicians improves health. We believe that enrollment in the atrial fibrillation clinic will allow [place] to better care for you.   The phone number to the Atrial Fibrillation Clinic is [phone]. The clinic is staffed Monday through Friday from 8:30am to 5pm.  Parking Directions: The clinic is located in the Heart and Vascular Building connected to [facility]. 1)From [address] turn on to [address] and go to the 3rd entrance  (Heart and Vascular entrance) on the right. 2)Look to the right for Heart &Vascular Parking Garage. 3)A code for the entrance is required, for July is 3342.   4)Take the elevators to the 1st floor. Registration is in the room with the glass walls at the end of the hallway.  If you have any trouble parking or locating the clinic, please don't hesitate to call [phone].

## 2020-11-11 NOTE — Transfer of Care (Signed)
Immediate Anesthesia Transfer of Care Note  Patient: John Ellis  Procedure(s) Performed: TRANSESOPHAGEAL ECHOCARDIOGRAM (TEE) BUBBLE STUDY  Patient Location: PACU  Anesthesia Type:MAC  Level of Consciousness: drowsy and patient cooperative  Airway & Oxygen Therapy: Patient Spontanous Breathing and Patient connected to nasal cannula oxygen  Post-op Assessment: Report given to RN and Post -op Vital signs reviewed and stable  Post vital signs: Reviewed and stable  Last Vitals:  Vitals Value Taken Time  BP 103/62 11/11/20 0942  Temp    Pulse 70 11/11/20 0942  Resp 15 11/11/20 0942  SpO2 95 % 11/11/20 0942  Vitals shown include unvalidated device data.  Last Pain:  Vitals:   11/11/20 0847  TempSrc: Oral  PainSc: 0-No pain         Complications: No notable events documented.

## 2020-11-11 NOTE — Anesthesia Procedure Notes (Signed)
Procedure Name: Intubation Date/Time: 11/11/2020 10:22 AM Performed by: Marny Lowenstein, CRNA Pre-anesthesia Checklist: Patient identified, Emergency Drugs available, Suction available and Patient being monitored Patient Re-evaluated:Patient Re-evaluated prior to induction Oxygen Delivery Method: Circle system utilized Preoxygenation: Pre-oxygenation with 100% oxygen Induction Type: IV induction Ventilation: Mask ventilation without difficulty Laryngoscope Size: Miller and 3 Grade View: Grade I Tube type: Oral Tube size: 7.5 mm Number of attempts: 1 Airway Equipment and Method: Patient positioned with wedge pillow and Stylet Placement Confirmation: ETT inserted through vocal cords under direct vision, positive ETCO2 and breath sounds checked- equal and bilateral Secured at: 24 cm Tube secured with: Tape Dental Injury: Teeth and Oropharynx as per pre-operative assessment

## 2020-11-11 NOTE — Anesthesia Postprocedure Evaluation (Signed)
Anesthesia Post Note  Patient: John Ellis  Procedure(s) Performed: TRANSESOPHAGEAL ECHOCARDIOGRAM (TEE) BUBBLE STUDY     Patient location during evaluation: PACU Anesthesia Type: MAC Level of consciousness: awake and alert Pain management: pain level controlled Vital Signs Assessment: post-procedure vital signs reviewed and stable Respiratory status: spontaneous breathing, nonlabored ventilation, respiratory function stable and patient connected to nasal cannula oxygen Cardiovascular status: stable and blood pressure returned to baseline Postop Assessment: no apparent nausea or vomiting Anesthetic complications: no   No notable events documented.  Last Vitals:  Vitals:   11/11/20 0954 11/11/20 1003  BP: 116/76 (!) 90/44  Pulse: (!) 58 (!) 58  Resp: 15 15  Temp:    SpO2: 96% 97%    Last Pain:  Vitals:   11/11/20 1003  TempSrc:   PainSc: 0-No pain                 Collier Bohnet

## 2020-11-11 NOTE — Anesthesia Preprocedure Evaluation (Addendum)
Anesthesia Evaluation  Patient identified by MRN, date of birth, ID band Patient awake    Reviewed: Allergy & Precautions, H&P , NPO status , Patient's Chart, lab work & pertinent test results, reviewed documented beta blocker date and time   Airway Mallampati: I  TM Distance: >3 FB Neck ROM: full    Dental no notable dental hx. (+) Teeth Intact, Dental Advisory Given   Pulmonary sleep apnea ,    Pulmonary exam normal breath sounds clear to auscultation       Cardiovascular Exercise Tolerance: Good + dysrhythmias Atrial Fibrillation  Rhythm:regular Rate:Normal     Neuro/Psych negative neurological ROS  negative psych ROS   GI/Hepatic negative GI ROS, Neg liver ROS,   Endo/Other  negative endocrine ROS  Renal/GU negative Renal ROS  negative genitourinary   Musculoskeletal   Abdominal   Peds  Hematology negative hematology ROS (+)   Anesthesia Other Findings   Reproductive/Obstetrics negative OB ROS                            Anesthesia Physical Anesthesia Plan  ASA: 3  Anesthesia Plan: MAC   Post-op Pain Management:    Induction:   PONV Risk Score and Plan: 1  Airway Management Planned: Natural Airway and Simple Face Mask  Additional Equipment: None  Intra-op Plan:   Post-operative Plan:   Informed Consent: I have reviewed the patients History and Physical, chart, labs and discussed the procedure including the risks, benefits and alternatives for the proposed anesthesia with the patient or authorized representative who has indicated his/her understanding and acceptance.     Dental Advisory Given  Plan Discussed with: CRNA and Anesthesiologist  Anesthesia Plan Comments:         Anesthesia Quick Evaluation

## 2020-11-11 NOTE — H&P (Signed)
Mr Rho is a 40 yom with PAF who presents for TEE prior to AF ablation.  Currently doing well, asymptomatic, but reports he has symptoms when goes into Afib.  Has been occurring more frequently, occurs weekly.  Takes flecainide as needed, reports Afib always resolves with flecainide, has nto required cardioversion.  GEN: Well nourished, well developed, in no acute distress HEENT: normal Neck: no JVD, Cardiac: RRR; no murmurs, rubs, or gallops,no edema  Respiratory:  clear to auscultation bilaterally, normal work of breathing GI: soft, nontender MS: no deformity or atrophy Skin: warm and dry Neuro:  Alert and Oriented x 3, Strength and sensation are intact Psych: normal affect  Plan: Proceed with TEE to evaluate atrial anatomy  rule out thrombus prior to AF ablation

## 2020-11-11 NOTE — H&P (Signed)
ID:  John Ellis, DOB 25-Feb-1966, MRN [id number]   PCP:  Daisy Floro, MD             Primary Electrophysiologist: Dr Graciela Husbands   CC: afib   History of Present Illness: John Ellis is a 55 y.o. male who presents today for electrophysiology study and ablation for afib.     He has had afib for 4-5 years.   Episodes have increased in frequency and duration since that time.  He now has episodes weekly.  + palpitations and fatigue.     Today, he denies symptoms of palpitations, chest pain, shortness of breath, orthopnea, PND, lower extremity edema, claudication, dizziness, presyncope, syncope, bleeding, or neurologic sequela. The patient is tolerating medications without difficulties and is otherwise without complaint today.         Past Medical History:  Diagnosis Date   Hemorrhoids     Keratoconus     Obstructive sleep apnea     Paroxysmal atrial fibrillation (HCC)     Rosacea     Ventricular premature depolarization           Past Surgical History:  Procedure Laterality Date   VASECTOMY         home meds reviewed    Allergies:   Patient has no known allergies.    Social History:  The patient  reports that he has never smoked. He has never used smokeless tobacco. He reports current alcohol use of about 2.0 standard drinks of alcohol per week. He reports that he does not use drugs.    Family History:  The patient's  family history includes Cancer in his mother and sister.      ROS:  Please see the history of present illness.   All other systems are personally reviewed and negative.     PHYSICAL EXAM: Vitals:   11/11/20 0847 11/11/20 0942  BP: 140/89 103/62  Pulse: (!) 54 72  Resp: 10 15  Temp: 99.3 F (37.4 C) 97.7 F (36.5 C)  SpO2: 98% 94%    GEN: Well nourished, well developed, in no acute distress HEENT: normal Neck: no JVD, carotid bruits, or masses Cardiac: RRR  Respiratory:   normal work of breathing GI: soft  MS: no deformity or atrophy Skin:  warm and dry Neuro:  Strength and sensation are intact Psych: euthymic mood, full affect    Other studies personally reviewed: Additional studies/ records that were reviewed today include: prior echo,  Dr Koren Bound records  Review of the above records today demonstrates: as above     ASSESSMENT AND PLAN:   1.  Paroxysmal atrial fibrillation The patient has symptomatic, recurrent  atrial fibrillation.   Risk, benefits, and alternatives to EP study and radiofrequency ablation for afib were again discussed in detail today. These risks include but are not limited to stroke, bleeding, vascular damage, tamponade, perforation, damage to the esophagus, lungs, and other structures, pulmonary vein stenosis, worsening renal function, and death. The patient understands these risk and wishes to proceed.   TEE reviewed with Dr Bjorn Pippin today  he reports compliance with OAC without interruption.  Hillis Range MD, [facility] 11/11/2020 9:48 AM

## 2020-11-11 NOTE — Anesthesia Postprocedure Evaluation (Signed)
Anesthesia Post Note  Patient: John Ellis  Procedure(s) Performed: ATRIAL FIBRILLATION ABLATION     Patient location during evaluation: PACU Anesthesia Type: General Level of consciousness: awake and alert Pain management: pain level controlled Vital Signs Assessment: post-procedure vital signs reviewed and stable Respiratory status: spontaneous breathing, nonlabored ventilation, respiratory function stable and patient connected to nasal cannula oxygen Cardiovascular status: blood pressure returned to baseline and stable Postop Assessment: no apparent nausea or vomiting Anesthetic complications: no   No notable events documented.  Last Vitals:  Vitals:   11/11/20 1500 11/11/20 1546  BP: 113/60 134/72  Pulse: 84   Resp: 12   Temp:    SpO2: 99% 99%    Last Pain:  Vitals:   11/11/20 1546  TempSrc:   PainSc: 0-No pain                 Shelton Silvas

## 2020-11-11 NOTE — Anesthesia Preprocedure Evaluation (Signed)
Anesthesia Evaluation  Patient identified by MRN, date of birth, ID band Patient awake    Reviewed: Allergy & Precautions, H&P , NPO status , Patient's Chart, lab work & pertinent test results, reviewed documented beta blocker date and time   Airway Mallampati: I  TM Distance: >3 FB Neck ROM: full    Dental no notable dental hx. (+) Teeth Intact, Dental Advisory Given   Pulmonary sleep apnea and Continuous Positive Airway Pressure Ventilation ,    Pulmonary exam normal breath sounds clear to auscultation       Cardiovascular Exercise Tolerance: Good + dysrhythmias Atrial Fibrillation  Rhythm:regular Rate:Normal     Neuro/Psych negative neurological ROS  negative psych ROS   GI/Hepatic negative GI ROS, Neg liver ROS,   Endo/Other  negative endocrine ROS  Renal/GU negative Renal ROS  negative genitourinary   Musculoskeletal   Abdominal   Peds  Hematology negative hematology ROS (+)   Anesthesia Other Findings   Reproductive/Obstetrics negative OB ROS                             Anesthesia Physical  Anesthesia Plan  ASA: 3  Anesthesia Plan: General   Post-op Pain Management:    Induction: Intravenous  PONV Risk Score and Plan: 1 and Ondansetron  Airway Management Planned: Oral ETT  Additional Equipment: None  Intra-op Plan:   Post-operative Plan: Extubation in OR  Informed Consent: I have reviewed the patients History and Physical, chart, labs and discussed the procedure including the risks, benefits and alternatives for the proposed anesthesia with the patient or authorized representative who has indicated his/her understanding and acceptance.     Dental Advisory Given  Plan Discussed with: CRNA and Anesthesiologist  Anesthesia Plan Comments: (  )        Anesthesia Quick Evaluation

## 2020-11-11 NOTE — CV Procedure (Signed)
     TRANSESOPHAGEAL ECHOCARDIOGRAM   NAME:  John Ellis   MRN: [id number] DOB:  1966-02-20   ADMIT DATE: 11/11/2020  INDICATIONS: Afib  PROCEDURE:   Informed consent was obtained prior to the procedure. The risks, benefits and alternatives for the procedure were discussed and the patient comprehended these risks.  Risks include, but are not limited to, cough, sore throat, vomiting, nausea, somnolence, esophageal and stomach trauma or perforation, bleeding, low blood pressure, aspiration, pneumonia, infection, trauma to the teeth and death.    After a procedural time-out, the oropharynx was anesthetized and the patient was sedated by the anesthesia service. The transesophageal probe was inserted in the esophagus and stomach without difficulty and multiple views were obtained. Anesthesia was monitored by Janann Colonel, CRNA and Dr Tacy Dura.    COMPLICATIONS:    There were no immediate complications.  FINDINGS:  No LAA thrombus.  Normal LV function.  No significant valvular disease.  Epifanio Lesches MD [facility]  [address] [place], [place] [address] [phone]   9:37 AM

## 2020-11-11 NOTE — Anesthesia Procedure Notes (Signed)
Procedure Name: MAC Date/Time: 11/11/2020 9:12 AM Performed by: Kathryne Hitch, CRNA Pre-anesthesia Checklist: Patient identified, Emergency Drugs available, Suction available and Patient being monitored Patient Re-evaluated:Patient Re-evaluated prior to induction Oxygen Delivery Method: Nasal cannula Preoxygenation: Pre-oxygenation with 100% oxygen Induction Type: IV induction Placement Confirmation: positive ETCO2 Dental Injury: Teeth and Oropharynx as per pre-operative assessment

## 2020-11-11 NOTE — Transfer of Care (Signed)
Immediate Anesthesia Transfer of Care Note  Patient: John Ellis  Procedure(s) Performed: ATRIAL FIBRILLATION ABLATION  Patient Location: PACU  Anesthesia Type:General  Level of Consciousness: drowsy and patient cooperative  Airway & Oxygen Therapy: Patient Spontanous Breathing and Patient connected to nasal cannula oxygen  Post-op Assessment: Report given to RN and Post -op Vital signs reviewed and stable  Post vital signs: Reviewed and stable  Last Vitals:  Vitals Value Taken Time  BP 132/83 11/11/20 1210  Temp 36.4 C 11/11/20 1209  Pulse 81 11/11/20 1212  Resp 17 11/11/20 1212  SpO2 99 % 11/11/20 1212  Vitals shown include unvalidated device data.  Last Pain:  Vitals:   11/11/20 1209  TempSrc: Temporal  PainSc: 0-No pain         Complications: No notable events documented.

## 2020-11-12 ENCOUNTER — Encounter (HOSPITAL_COMMUNITY): Payer: Self-pay | Admitting: Internal Medicine

## 2020-11-13 ENCOUNTER — Encounter (HOSPITAL_COMMUNITY): Payer: Self-pay | Admitting: Cardiology

## 2020-11-13 MED FILL — Heparin Sodium (Porcine) Inj 1000 Unit/ML: INTRAMUSCULAR | Qty: 10 | Status: AC

## 2020-12-02 DIAGNOSIS — M25512 Pain in left shoulder: Secondary | ICD-10-CM | POA: Diagnosis not present

## 2020-12-09 ENCOUNTER — Other Ambulatory Visit: Payer: Self-pay

## 2020-12-09 ENCOUNTER — Encounter (HOSPITAL_COMMUNITY): Payer: Self-pay | Admitting: Nurse Practitioner

## 2020-12-09 ENCOUNTER — Ambulatory Visit (HOSPITAL_COMMUNITY)
Admission: RE | Admit: 2020-12-09 | Discharge: 2020-12-09 | Disposition: A | Discharge status: Home / Self Care | Payer: BC Managed Care – PPO | Source: Ambulatory Visit | Attending: Nurse Practitioner | Admitting: Nurse Practitioner

## 2020-12-09 VITALS — BP 114/58 | HR 65 | Ht 74.0 in | Wt 194.6 lb

## 2020-12-09 DIAGNOSIS — Z91030 Bee allergy status: Secondary | ICD-10-CM | POA: Insufficient documentation

## 2020-12-09 DIAGNOSIS — Z7901 Long term (current) use of anticoagulants: Secondary | ICD-10-CM | POA: Diagnosis not present

## 2020-12-09 DIAGNOSIS — M542 Cervicalgia: Secondary | ICD-10-CM | POA: Diagnosis not present

## 2020-12-09 DIAGNOSIS — I480 Paroxysmal atrial fibrillation: Secondary | ICD-10-CM | POA: Insufficient documentation

## 2020-12-09 DIAGNOSIS — Z79899 Other long term (current) drug therapy: Secondary | ICD-10-CM | POA: Diagnosis not present

## 2020-12-09 DIAGNOSIS — D6869 Other thrombophilia: Secondary | ICD-10-CM | POA: Diagnosis not present

## 2020-12-09 NOTE — Progress Notes (Signed)
Primary Care Physician: John Floro, MD Referring Physician: Dr. Bosie Helper Duarte is a 55 y.o. male with a h/o paroxysmal afib. He had an ablation one month ago. He has ben staying in SR. No swallowing or groin issues. Takes flecainide and metoprolol as needed but has not had to use. He continues on xarleto with a CHA2DS2VASc score of 0.   Today, he denies symptoms of palpitations, chest pain, shortness of breath, orthopnea, PND, lower extremity edema, dizziness, presyncope, syncope, or neurologic sequela. The patient is tolerating medications without difficulties and is otherwise without complaint today.   Past Medical History:  Diagnosis Date   Hemorrhoids    Keratoconus    Obstructive sleep apnea    Paroxysmal atrial fibrillation (HCC)    Rosacea    Ventricular premature depolarization    Past Surgical History:  Procedure Laterality Date   ATRIAL FIBRILLATION ABLATION N/A 11/11/2020   Procedure: ATRIAL FIBRILLATION ABLATION;  Surgeon: Hillis Range, MD;  Location: [facility] INVASIVE CV LAB;  Service: Cardiovascular;  Laterality: N/A;   BUBBLE STUDY  11/11/2020   Procedure: BUBBLE STUDY;  Surgeon: Little Ishikawa, MD;  Location: [facility] ENDOSCOPY;  Service: Cardiovascular;;   TEE WITHOUT CARDIOVERSION N/A 11/11/2020   Procedure: TRANSESOPHAGEAL ECHOCARDIOGRAM (TEE);  Surgeon: Little Ishikawa, MD;  Location: [facility] ENDOSCOPY;  Service: Cardiovascular;  Laterality: N/A;   VASECTOMY      Current Outpatient Medications  Medication Sig Dispense Refill   aspirin-acetaminophen-caffeine (EXCEDRIN MIGRAINE) 250-250-65 MG tablet Take 2 tablets by mouth as needed for headache.     calcium carbonate (TUMS EX) 750 MG chewable tablet Chew 1,500 mg by mouth daily as needed for heartburn.     EPINEPHrine 0.3 mg/0.3 mL IJ SOAJ injection Inject 0.3 mg into the muscle as needed for anaphylaxis.      flecainide (TAMBOCOR) 150 MG tablet TAKE 2 TABLETS BY MOUTH AS NEEDED (COMBINED WITH  METOPROLOL 25 MG FOR AFIB EPISODES). (Patient taking differently: Take 300 mg by mouth See admin instructions. TAKE 300 MG BY MOUTH AS NEEDED FOR AFIB EPISODES (COMBINED WITH METOPROLOL 25 MG).) 60 tablet 11   levocetirizine (XYZAL) 5 MG tablet Take 5 mg by mouth as needed for allergies.     metoprolol tartrate (LOPRESSOR) 25 MG tablet TAKE 1 TABLET BY MOUTH AS NEEDED (COMBINED WITH FLECAINDIE 300 MG FOR AFIB EPISODES). (Patient taking differently: Take 25 mg by mouth See admin instructions. TAKE 25 MG BY MOUTH AS NEEDED FOR AFIB EPISODES(COMBINED WITH FLECAINIDE).) 30 tablet 11   Multiple Vitamin (MULTIVITAMIN) tablet Take 3 tablets by mouth daily.     OVER THE COUNTER MEDICATION Take 1 capsule by mouth daily. P5P 50 supplement     pantoprazole (PROTONIX) 40 MG tablet Take 1 tablet (40 mg total) by mouth daily. 45 tablet 0   rivaroxaban (XARELTO) 20 MG TABS tablet Take 1 tablet (20 mg total) by mouth daily with supper. 90 tablet 3   No current facility-administered medications for this encounter.    Allergies  Allergen Reactions   Wasp Venom Swelling   Yellow Jacket Venom Swelling    Social History   Socioeconomic History   Marital status: Married    Spouse name: Not on file   Number of children: Not on file   Years of education: Not on file   Highest education level: Not on file  Occupational History   Occupation: Estate manager/land agent  Tobacco Use   Smoking status: Never   Smokeless tobacco: Never  Vaping Use   Vaping Use: Never used  Substance and Sexual Activity   Alcohol use: Yes    Alcohol/week: 2.0 standard drinks    Types: 1 Cans of beer, 1 Glasses of wine per week    Comment: Weekly.   Drug use: No   Sexual activity: Not on file  Other Topics Concern   Not on file  Social History Narrative   Not on file   Social Determinants of Health   Financial Resource Strain: Not on file  Food Insecurity: Not on file  Transportation Needs: Not on file  Physical Activity: Not  on file  Stress: Not on file  Social Connections: Not on file  Intimate Partner Violence: Not on file    Family History  Problem Relation Age of Onset   Cancer Mother        Breast,Colon (Basaloid Squamous Cell Carcinoma)   Cancer Sister        Thyroid    ROS- All systems are reviewed and negative except as per the HPI above  Physical Exam: Vitals:   12/09/20 1520  BP: (!) 114/58  Pulse: 65  Weight: 88.3 kg  Height: 6\' 2"  (1.88 m)   Wt Readings from Last 3 Encounters:  12/09/20 88.3 kg  11/11/20 86.2 kg  10/03/20 87.2 kg    Labs: Lab Results  Component Value Date   NA 138 11/11/2020   K 3.8 11/11/2020   CL 102 11/11/2020   CO2 28 11/11/2020   GLUCOSE 107 (H) 11/11/2020   BUN 13 11/11/2020   CREATININE 0.81 11/11/2020   CALCIUM 9.5 11/11/2020   No results found for: INR No results found for: CHOL, HDL, LDLCALC, TRIG   GEN- The patient is well appearing, alert and oriented x 3 today.   Head- normocephalic, atraumatic Eyes-  Sclera clear, conjunctiva pink Ears- hearing intact Oropharynx- clear Neck- supple, no JVP Lymph- no cervical lymphadenopathy Lungs- Clear to ausculation bilaterally, normal work of breathing Heart- Regular rate and rhythm, no murmurs, rubs or gallops, PMI not laterally displaced GI- soft, NT, ND, + BS Extremities- no clubbing, cyanosis, or edema MS- no significant deformity or atrophy Skin- no rash or lesion Psych- euthymic mood, full affect Neuro- strength and sensation are intact  EKG-NSR at 65 bpm, pr int 152 ms, qrs int 100 ms, qtc 418 ms   Assessment and Plan:  1. Afib S/p ablation  Doing well staying in SR He has flecainide and BB if needed for episodes   2. CHA2DS2VASc score is 0  Continue xarelto 30 mg daily without interruption   F/u with Dr. 11/13/2020 9/21   10/21. [facility] [address] [place], [place] [phone]

## 2020-12-19 ENCOUNTER — Other Ambulatory Visit: Payer: Self-pay | Admitting: Internal Medicine

## 2020-12-24 DIAGNOSIS — M5412 Radiculopathy, cervical region: Secondary | ICD-10-CM | POA: Diagnosis not present

## 2020-12-24 DIAGNOSIS — M5032 Other cervical disc degeneration, mid-cervical region, unspecified level: Secondary | ICD-10-CM | POA: Diagnosis not present

## 2020-12-29 DIAGNOSIS — M542 Cervicalgia: Secondary | ICD-10-CM | POA: Diagnosis not present

## 2020-12-29 DIAGNOSIS — S46012D Strain of muscle(s) and tendon(s) of the rotator cuff of left shoulder, subsequent encounter: Secondary | ICD-10-CM | POA: Diagnosis not present

## 2020-12-31 DIAGNOSIS — S46012D Strain of muscle(s) and tendon(s) of the rotator cuff of left shoulder, subsequent encounter: Secondary | ICD-10-CM | POA: Diagnosis not present

## 2020-12-31 DIAGNOSIS — M542 Cervicalgia: Secondary | ICD-10-CM | POA: Diagnosis not present

## 2021-01-06 DIAGNOSIS — S46012D Strain of muscle(s) and tendon(s) of the rotator cuff of left shoulder, subsequent encounter: Secondary | ICD-10-CM | POA: Diagnosis not present

## 2021-01-06 DIAGNOSIS — M542 Cervicalgia: Secondary | ICD-10-CM | POA: Diagnosis not present

## 2021-01-12 DIAGNOSIS — S46021D Laceration of muscle(s) and tendon(s) of the rotator cuff of right shoulder, subsequent encounter: Secondary | ICD-10-CM | POA: Diagnosis not present

## 2021-01-12 DIAGNOSIS — M542 Cervicalgia: Secondary | ICD-10-CM | POA: Diagnosis not present

## 2021-01-15 DIAGNOSIS — M542 Cervicalgia: Secondary | ICD-10-CM | POA: Diagnosis not present

## 2021-01-15 DIAGNOSIS — S46012D Strain of muscle(s) and tendon(s) of the rotator cuff of left shoulder, subsequent encounter: Secondary | ICD-10-CM | POA: Diagnosis not present

## 2021-01-20 DIAGNOSIS — M542 Cervicalgia: Secondary | ICD-10-CM | POA: Diagnosis not present

## 2021-01-20 DIAGNOSIS — S46012D Strain of muscle(s) and tendon(s) of the rotator cuff of left shoulder, subsequent encounter: Secondary | ICD-10-CM | POA: Diagnosis not present

## 2021-01-22 DIAGNOSIS — S46012D Strain of muscle(s) and tendon(s) of the rotator cuff of left shoulder, subsequent encounter: Secondary | ICD-10-CM | POA: Diagnosis not present

## 2021-01-22 DIAGNOSIS — M542 Cervicalgia: Secondary | ICD-10-CM | POA: Diagnosis not present

## 2021-01-29 DIAGNOSIS — M542 Cervicalgia: Secondary | ICD-10-CM | POA: Diagnosis not present

## 2021-01-29 DIAGNOSIS — S46012D Strain of muscle(s) and tendon(s) of the rotator cuff of left shoulder, subsequent encounter: Secondary | ICD-10-CM | POA: Diagnosis not present

## 2021-02-03 DIAGNOSIS — S46012D Strain of muscle(s) and tendon(s) of the rotator cuff of left shoulder, subsequent encounter: Secondary | ICD-10-CM | POA: Diagnosis not present

## 2021-02-03 DIAGNOSIS — M542 Cervicalgia: Secondary | ICD-10-CM | POA: Diagnosis not present

## 2021-02-05 DIAGNOSIS — S46012D Strain of muscle(s) and tendon(s) of the rotator cuff of left shoulder, subsequent encounter: Secondary | ICD-10-CM | POA: Diagnosis not present

## 2021-02-05 DIAGNOSIS — M542 Cervicalgia: Secondary | ICD-10-CM | POA: Diagnosis not present

## 2021-02-11 ENCOUNTER — Other Ambulatory Visit: Payer: Self-pay

## 2021-02-11 ENCOUNTER — Ambulatory Visit (INDEPENDENT_AMBULATORY_CARE_PROVIDER_SITE_OTHER): Payer: BC Managed Care – PPO | Admitting: Internal Medicine

## 2021-02-11 VITALS — BP 116/70 | HR 56 | Ht 74.0 in | Wt 191.4 lb

## 2021-02-11 DIAGNOSIS — I480 Paroxysmal atrial fibrillation: Secondary | ICD-10-CM

## 2021-02-11 DIAGNOSIS — D6869 Other thrombophilia: Secondary | ICD-10-CM | POA: Diagnosis not present

## 2021-02-11 NOTE — Progress Notes (Signed)
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  Since his recent afib ablation, the patient reports doing very well.  he denies procedure related complications and is pleased with the results of the procedure.  Today, he denies symptoms of palpitations, chest pain, shortness of breath,  lower extremity edema, dizziness, presyncope, or syncope.  The patient is otherwise without complaint today.   Past Medical History:  Diagnosis Date   Hemorrhoids    Keratoconus    Obstructive sleep apnea    Paroxysmal atrial fibrillation (HCC)    Rosacea    Ventricular premature depolarization    Past Surgical History:  Procedure Laterality Date   ATRIAL FIBRILLATION ABLATION N/A 11/11/2020   Procedure: ATRIAL FIBRILLATION ABLATION;  Surgeon: Hillis Range, MD;  Location: [facility] INVASIVE CV LAB;  Service: Cardiovascular;  Laterality: N/A;   BUBBLE STUDY  11/11/2020   Procedure: BUBBLE STUDY;  Surgeon: Little Ishikawa, MD;  Location: [facility] ENDOSCOPY;  Service: Cardiovascular;;   TEE WITHOUT CARDIOVERSION N/A 11/11/2020   Procedure: TRANSESOPHAGEAL ECHOCARDIOGRAM (TEE);  Surgeon: Little Ishikawa, MD;  Location: [facility] ENDOSCOPY;  Service: Cardiovascular;  Laterality: N/A;   VASECTOMY      ROS- all systems are personally reviewed and negatives except as per HPI above  Current Outpatient Medications  Medication Sig Dispense Refill   aspirin-acetaminophen-caffeine (EXCEDRIN MIGRAINE) 250-250-65 MG tablet Take 2 tablets by mouth as needed for headache.     calcium carbonate (TUMS EX) 750 MG chewable tablet Chew 1,500 mg by mouth daily as needed for heartburn.     EPINEPHrine 0.3 mg/0.3 mL IJ SOAJ injection Inject 0.3 mg into the muscle as needed for anaphylaxis.      flecainide (TAMBOCOR) 150 MG tablet TAKE 2 TABLETS BY MOUTH AS NEEDED (COMBINED WITH METOPROLOL 25 MG FOR AFIB EPISODES). (Patient taking  differently: Take 300 mg by mouth See admin instructions. TAKE 300 MG BY MOUTH AS NEEDED FOR AFIB EPISODES (COMBINED WITH METOPROLOL 25 MG).) 60 tablet 11   levocetirizine (XYZAL) 5 MG tablet Take 5 mg by mouth as needed for allergies.     metoprolol tartrate (LOPRESSOR) 25 MG tablet TAKE 1 TABLET BY MOUTH AS NEEDED (COMBINED WITH FLECAINDIE 300 MG FOR AFIB EPISODES). (Patient taking differently: Take 25 mg by mouth See admin instructions. TAKE 25 MG BY MOUTH AS NEEDED FOR AFIB EPISODES(COMBINED WITH FLECAINIDE).) 30 tablet 11   Multiple Vitamin (MULTIVITAMIN) tablet Take 3 tablets by mouth daily.     OVER THE COUNTER MEDICATION Take 1 capsule by mouth daily. P5P 50 supplement     rivaroxaban (XARELTO) 20 MG TABS tablet Take 1 tablet (20 mg total) by mouth daily with supper. 90 tablet 3   No current facility-administered medications for this visit.    Physical Exam: Vitals:   02/11/21 0912  BP: 116/70  Pulse: (!) 56  SpO2: 98%  Weight: 191 lb 6.4 oz (86.8 kg)  Height: 6\' 2"  (1.88 m)    GEN- The patient is well appearing, alert and oriented x 3 today.   Head- normocephalic, atraumatic Eyes-  Sclera clear, conjunctiva pink Ears- hearing intact Oropharynx- clear Lungs- Clear to ausculation bilaterally, normal work of breathing Heart- Regular rate and rhythm, no murmurs, rubs or gallops, PMI not laterally displaced GI- soft, NT, ND, + BS Extremities- no clubbing, cyanosis, or edema  EKG tracing ordered today is personally reviewed and shows sinus bradycardia  Assessment and  Plan:  1. Paroxysmal atrial fibrillation Doing well s/p ablation chads2vasc score is 0 stop xarelto today   Return to see EPAPP or Dr Graciela Husbands in 12 months  Hillis Range MD, [facility] 02/11/2021 9:23 AM

## 2021-02-11 NOTE — Patient Instructions (Addendum)
Medication Instructions: Stop Xarelto Your physician recommends that you continue on your current medications as directed. Please refer to the Current Medication list given to you today.  Labwork: None ordered.  Testing/Procedures: None ordered.  Follow-Up: Your physician wants you to follow-up in: 12 months with  Hillis Range, MD or one of the following Advanced Practice Providers on your designated Care Team:    Francis Dowse, PA-C    You will receive a reminder letter in the mail two months in advance. If you don't receive a letter, please call our office to schedule the follow-up appointment.   Any Other Special Instructions Will Be Listed Below (If Applicable).  If you need a refill on your cardiac medications before your next appointment, please call your pharmacy.

## 2021-02-12 DIAGNOSIS — S46012D Strain of muscle(s) and tendon(s) of the rotator cuff of left shoulder, subsequent encounter: Secondary | ICD-10-CM | POA: Diagnosis not present

## 2021-02-12 DIAGNOSIS — M5412 Radiculopathy, cervical region: Secondary | ICD-10-CM | POA: Diagnosis not present

## 2021-02-12 DIAGNOSIS — M542 Cervicalgia: Secondary | ICD-10-CM | POA: Diagnosis not present

## 2021-02-16 DIAGNOSIS — M542 Cervicalgia: Secondary | ICD-10-CM | POA: Diagnosis not present

## 2021-02-16 DIAGNOSIS — S46012D Strain of muscle(s) and tendon(s) of the rotator cuff of left shoulder, subsequent encounter: Secondary | ICD-10-CM | POA: Diagnosis not present

## 2021-02-20 DIAGNOSIS — S46012D Strain of muscle(s) and tendon(s) of the rotator cuff of left shoulder, subsequent encounter: Secondary | ICD-10-CM | POA: Diagnosis not present

## 2021-02-20 DIAGNOSIS — M542 Cervicalgia: Secondary | ICD-10-CM | POA: Diagnosis not present

## 2021-02-24 DIAGNOSIS — J00 Acute nasopharyngitis [common cold]: Secondary | ICD-10-CM | POA: Diagnosis not present

## 2021-02-26 DIAGNOSIS — J019 Acute sinusitis, unspecified: Secondary | ICD-10-CM | POA: Diagnosis not present

## 2021-03-02 DIAGNOSIS — S46012D Strain of muscle(s) and tendon(s) of the rotator cuff of left shoulder, subsequent encounter: Secondary | ICD-10-CM | POA: Diagnosis not present

## 2021-03-02 DIAGNOSIS — M542 Cervicalgia: Secondary | ICD-10-CM | POA: Diagnosis not present

## 2021-03-05 DIAGNOSIS — S46012D Strain of muscle(s) and tendon(s) of the rotator cuff of left shoulder, subsequent encounter: Secondary | ICD-10-CM | POA: Diagnosis not present

## 2021-03-05 DIAGNOSIS — M542 Cervicalgia: Secondary | ICD-10-CM | POA: Diagnosis not present

## 2021-03-10 DIAGNOSIS — M542 Cervicalgia: Secondary | ICD-10-CM | POA: Diagnosis not present

## 2021-03-10 DIAGNOSIS — S46012D Strain of muscle(s) and tendon(s) of the rotator cuff of left shoulder, subsequent encounter: Secondary | ICD-10-CM | POA: Diagnosis not present

## 2021-03-13 DIAGNOSIS — U099 Post covid-19 condition, unspecified: Secondary | ICD-10-CM | POA: Diagnosis not present

## 2021-03-13 DIAGNOSIS — R5381 Other malaise: Secondary | ICD-10-CM | POA: Diagnosis not present

## 2021-03-13 DIAGNOSIS — Z23 Encounter for immunization: Secondary | ICD-10-CM | POA: Diagnosis not present

## 2021-03-17 DIAGNOSIS — M542 Cervicalgia: Secondary | ICD-10-CM | POA: Diagnosis not present

## 2021-03-17 DIAGNOSIS — S46012D Strain of muscle(s) and tendon(s) of the rotator cuff of left shoulder, subsequent encounter: Secondary | ICD-10-CM | POA: Diagnosis not present

## 2021-03-19 DIAGNOSIS — M5412 Radiculopathy, cervical region: Secondary | ICD-10-CM | POA: Diagnosis not present

## 2021-03-20 DIAGNOSIS — S46012D Strain of muscle(s) and tendon(s) of the rotator cuff of left shoulder, subsequent encounter: Secondary | ICD-10-CM | POA: Diagnosis not present

## 2021-03-20 DIAGNOSIS — M542 Cervicalgia: Secondary | ICD-10-CM | POA: Diagnosis not present

## 2021-03-27 DIAGNOSIS — S46012D Strain of muscle(s) and tendon(s) of the rotator cuff of left shoulder, subsequent encounter: Secondary | ICD-10-CM | POA: Diagnosis not present

## 2021-03-27 DIAGNOSIS — M542 Cervicalgia: Secondary | ICD-10-CM | POA: Diagnosis not present

## 2021-04-03 DIAGNOSIS — S46012D Strain of muscle(s) and tendon(s) of the rotator cuff of left shoulder, subsequent encounter: Secondary | ICD-10-CM | POA: Diagnosis not present

## 2021-04-03 DIAGNOSIS — M542 Cervicalgia: Secondary | ICD-10-CM | POA: Diagnosis not present

## 2021-04-06 DIAGNOSIS — S46012D Strain of muscle(s) and tendon(s) of the rotator cuff of left shoulder, subsequent encounter: Secondary | ICD-10-CM | POA: Diagnosis not present

## 2021-04-06 DIAGNOSIS — M542 Cervicalgia: Secondary | ICD-10-CM | POA: Diagnosis not present

## 2021-04-08 DIAGNOSIS — M542 Cervicalgia: Secondary | ICD-10-CM | POA: Diagnosis not present

## 2021-04-08 DIAGNOSIS — S46012D Strain of muscle(s) and tendon(s) of the rotator cuff of left shoulder, subsequent encounter: Secondary | ICD-10-CM | POA: Diagnosis not present

## 2021-04-15 DIAGNOSIS — M542 Cervicalgia: Secondary | ICD-10-CM | POA: Diagnosis not present

## 2021-04-15 DIAGNOSIS — S46012D Strain of muscle(s) and tendon(s) of the rotator cuff of left shoulder, subsequent encounter: Secondary | ICD-10-CM | POA: Diagnosis not present

## 2021-04-24 DIAGNOSIS — M542 Cervicalgia: Secondary | ICD-10-CM | POA: Diagnosis not present

## 2021-04-24 DIAGNOSIS — S46012D Strain of muscle(s) and tendon(s) of the rotator cuff of left shoulder, subsequent encounter: Secondary | ICD-10-CM | POA: Diagnosis not present

## 2021-04-28 DIAGNOSIS — S46012D Strain of muscle(s) and tendon(s) of the rotator cuff of left shoulder, subsequent encounter: Secondary | ICD-10-CM | POA: Diagnosis not present

## 2021-04-28 DIAGNOSIS — M542 Cervicalgia: Secondary | ICD-10-CM | POA: Diagnosis not present

## 2021-05-08 DIAGNOSIS — M542 Cervicalgia: Secondary | ICD-10-CM | POA: Diagnosis not present

## 2021-05-08 DIAGNOSIS — S46012D Strain of muscle(s) and tendon(s) of the rotator cuff of left shoulder, subsequent encounter: Secondary | ICD-10-CM | POA: Diagnosis not present

## 2021-05-13 DIAGNOSIS — N401 Enlarged prostate with lower urinary tract symptoms: Secondary | ICD-10-CM | POA: Diagnosis not present

## 2021-05-13 DIAGNOSIS — R351 Nocturia: Secondary | ICD-10-CM | POA: Diagnosis not present

## 2021-06-12 DIAGNOSIS — R079 Chest pain, unspecified: Secondary | ICD-10-CM | POA: Diagnosis not present

## 2021-06-12 DIAGNOSIS — R002 Palpitations: Secondary | ICD-10-CM | POA: Diagnosis not present

## 2021-06-12 DIAGNOSIS — R0789 Other chest pain: Secondary | ICD-10-CM | POA: Diagnosis not present

## 2021-06-12 DIAGNOSIS — R55 Syncope and collapse: Secondary | ICD-10-CM | POA: Diagnosis not present

## 2021-08-27 DIAGNOSIS — D225 Melanocytic nevi of trunk: Secondary | ICD-10-CM | POA: Diagnosis not present

## 2021-08-27 DIAGNOSIS — D2261 Melanocytic nevi of right upper limb, including shoulder: Secondary | ICD-10-CM | POA: Diagnosis not present

## 2021-08-27 DIAGNOSIS — D2262 Melanocytic nevi of left upper limb, including shoulder: Secondary | ICD-10-CM | POA: Diagnosis not present

## 2021-08-27 DIAGNOSIS — L821 Other seborrheic keratosis: Secondary | ICD-10-CM | POA: Diagnosis not present

## 2021-09-08 DIAGNOSIS — J0100 Acute maxillary sinusitis, unspecified: Secondary | ICD-10-CM | POA: Diagnosis not present

## 2021-09-21 DIAGNOSIS — Z20822 Contact with and (suspected) exposure to covid-19: Secondary | ICD-10-CM | POA: Diagnosis not present

## 2021-10-12 DIAGNOSIS — Z13220 Encounter for screening for lipoid disorders: Secondary | ICD-10-CM | POA: Diagnosis not present

## 2021-10-12 DIAGNOSIS — Z0000 Encounter for general adult medical examination without abnormal findings: Secondary | ICD-10-CM | POA: Diagnosis not present

## 2021-10-12 DIAGNOSIS — R7309 Other abnormal glucose: Secondary | ICD-10-CM | POA: Diagnosis not present

## 2021-10-15 DIAGNOSIS — Z0000 Encounter for general adult medical examination without abnormal findings: Secondary | ICD-10-CM | POA: Diagnosis not present

## 2022-02-17 DIAGNOSIS — M79672 Pain in left foot: Secondary | ICD-10-CM | POA: Diagnosis not present

## 2022-04-27 DIAGNOSIS — J019 Acute sinusitis, unspecified: Secondary | ICD-10-CM | POA: Diagnosis not present

## 2022-05-09 DIAGNOSIS — J1089 Influenza due to other identified influenza virus with other manifestations: Secondary | ICD-10-CM | POA: Diagnosis not present

## 2022-05-12 DIAGNOSIS — R351 Nocturia: Secondary | ICD-10-CM | POA: Diagnosis not present

## 2022-05-12 DIAGNOSIS — N401 Enlarged prostate with lower urinary tract symptoms: Secondary | ICD-10-CM | POA: Diagnosis not present

## 2022-07-01 ENCOUNTER — Encounter (HOSPITAL_COMMUNITY): Payer: Self-pay | Admitting: *Deleted

## 2022-09-02 DIAGNOSIS — L821 Other seborrheic keratosis: Secondary | ICD-10-CM | POA: Diagnosis not present

## 2022-09-02 DIAGNOSIS — D0362 Melanoma in situ of left upper limb, including shoulder: Secondary | ICD-10-CM | POA: Diagnosis not present

## 2022-09-02 DIAGNOSIS — L814 Other melanin hyperpigmentation: Secondary | ICD-10-CM | POA: Diagnosis not present

## 2022-09-02 DIAGNOSIS — L570 Actinic keratosis: Secondary | ICD-10-CM | POA: Diagnosis not present

## 2022-09-11 DIAGNOSIS — L0100 Impetigo, unspecified: Secondary | ICD-10-CM | POA: Diagnosis not present

## 2022-09-20 DIAGNOSIS — L988 Other specified disorders of the skin and subcutaneous tissue: Secondary | ICD-10-CM | POA: Diagnosis not present

## 2022-09-20 DIAGNOSIS — D0362 Melanoma in situ of left upper limb, including shoulder: Secondary | ICD-10-CM | POA: Diagnosis not present

## 2022-10-17 DIAGNOSIS — F419 Anxiety disorder, unspecified: Secondary | ICD-10-CM | POA: Diagnosis not present

## 2022-12-13 DIAGNOSIS — B351 Tinea unguium: Secondary | ICD-10-CM | POA: Diagnosis not present

## 2022-12-13 DIAGNOSIS — R7309 Other abnormal glucose: Secondary | ICD-10-CM | POA: Diagnosis not present

## 2022-12-13 DIAGNOSIS — G2581 Restless legs syndrome: Secondary | ICD-10-CM | POA: Diagnosis not present

## 2022-12-13 DIAGNOSIS — Z0000 Encounter for general adult medical examination without abnormal findings: Secondary | ICD-10-CM | POA: Diagnosis not present

## 2022-12-22 DIAGNOSIS — Z13220 Encounter for screening for lipoid disorders: Secondary | ICD-10-CM | POA: Diagnosis not present

## 2022-12-22 DIAGNOSIS — R7309 Other abnormal glucose: Secondary | ICD-10-CM | POA: Diagnosis not present

## 2022-12-22 DIAGNOSIS — Z0000 Encounter for general adult medical examination without abnormal findings: Secondary | ICD-10-CM | POA: Diagnosis not present

## 2023-03-01 DIAGNOSIS — Z85820 Personal history of malignant melanoma of skin: Secondary | ICD-10-CM | POA: Diagnosis not present

## 2023-03-01 DIAGNOSIS — D225 Melanocytic nevi of trunk: Secondary | ICD-10-CM | POA: Diagnosis not present

## 2023-03-01 DIAGNOSIS — L720 Epidermal cyst: Secondary | ICD-10-CM | POA: Diagnosis not present

## 2023-03-01 DIAGNOSIS — L814 Other melanin hyperpigmentation: Secondary | ICD-10-CM | POA: Diagnosis not present

## 2023-04-09 DIAGNOSIS — H6691 Otitis media, unspecified, right ear: Secondary | ICD-10-CM | POA: Diagnosis not present

## 2023-04-09 DIAGNOSIS — R062 Wheezing: Secondary | ICD-10-CM | POA: Diagnosis not present

## 2023-04-09 DIAGNOSIS — R051 Acute cough: Secondary | ICD-10-CM | POA: Diagnosis not present

## 2023-04-15 DIAGNOSIS — R051 Acute cough: Secondary | ICD-10-CM | POA: Diagnosis not present

## 2023-05-11 DIAGNOSIS — R972 Elevated prostate specific antigen [PSA]: Secondary | ICD-10-CM | POA: Diagnosis not present

## 2023-05-11 DIAGNOSIS — R351 Nocturia: Secondary | ICD-10-CM | POA: Diagnosis not present

## 2023-05-11 DIAGNOSIS — N401 Enlarged prostate with lower urinary tract symptoms: Secondary | ICD-10-CM | POA: Diagnosis not present

## 2023-07-19 DIAGNOSIS — L821 Other seborrheic keratosis: Secondary | ICD-10-CM | POA: Diagnosis not present

## 2023-08-30 DIAGNOSIS — L905 Scar conditions and fibrosis of skin: Secondary | ICD-10-CM | POA: Diagnosis not present

## 2023-08-30 DIAGNOSIS — D225 Melanocytic nevi of trunk: Secondary | ICD-10-CM | POA: Diagnosis not present

## 2023-08-30 DIAGNOSIS — L814 Other melanin hyperpigmentation: Secondary | ICD-10-CM | POA: Diagnosis not present

## 2023-08-30 DIAGNOSIS — D485 Neoplasm of uncertain behavior of skin: Secondary | ICD-10-CM | POA: Diagnosis not present

## 2023-08-30 DIAGNOSIS — L570 Actinic keratosis: Secondary | ICD-10-CM | POA: Diagnosis not present

## 2023-08-30 DIAGNOSIS — L821 Other seborrheic keratosis: Secondary | ICD-10-CM | POA: Diagnosis not present

## 2023-08-30 DIAGNOSIS — D2271 Melanocytic nevi of right lower limb, including hip: Secondary | ICD-10-CM | POA: Diagnosis not present

## 2023-10-21 ENCOUNTER — Other Ambulatory Visit (HOSPITAL_BASED_OUTPATIENT_CLINIC_OR_DEPARTMENT_OTHER): Payer: Self-pay | Admitting: Family Medicine

## 2023-10-21 DIAGNOSIS — E7800 Pure hypercholesterolemia, unspecified: Secondary | ICD-10-CM

## 2023-10-28 ENCOUNTER — Ambulatory Visit (HOSPITAL_BASED_OUTPATIENT_CLINIC_OR_DEPARTMENT_OTHER)
Admission: RE | Admit: 2023-10-28 | Discharge: 2023-10-28 | Disposition: A | Discharge status: Home / Self Care | Payer: Self-pay | Source: Ambulatory Visit | Attending: Family Medicine | Admitting: Family Medicine

## 2023-10-28 DIAGNOSIS — E7800 Pure hypercholesterolemia, unspecified: Secondary | ICD-10-CM | POA: Insufficient documentation

## 2023-11-03 DIAGNOSIS — J019 Acute sinusitis, unspecified: Secondary | ICD-10-CM | POA: Diagnosis not present

## 2023-11-03 DIAGNOSIS — J209 Acute bronchitis, unspecified: Secondary | ICD-10-CM | POA: Diagnosis not present

## 2023-11-03 DIAGNOSIS — J069 Acute upper respiratory infection, unspecified: Secondary | ICD-10-CM | POA: Diagnosis not present

## 2023-11-10 DIAGNOSIS — I4891 Unspecified atrial fibrillation: Secondary | ICD-10-CM | POA: Diagnosis not present

## 2023-11-10 DIAGNOSIS — Z83719 Family history of colon polyps, unspecified: Secondary | ICD-10-CM | POA: Diagnosis not present

## 2023-11-10 DIAGNOSIS — Z860101 Personal history of adenomatous and serrated colon polyps: Secondary | ICD-10-CM | POA: Diagnosis not present

## 2023-11-10 DIAGNOSIS — M47812 Spondylosis without myelopathy or radiculopathy, cervical region: Secondary | ICD-10-CM | POA: Diagnosis not present

## 2023-11-10 DIAGNOSIS — S46011D Strain of muscle(s) and tendon(s) of the rotator cuff of right shoulder, subsequent encounter: Secondary | ICD-10-CM | POA: Diagnosis not present

## 2023-11-17 DIAGNOSIS — D485 Neoplasm of uncertain behavior of skin: Secondary | ICD-10-CM | POA: Diagnosis not present

## 2023-11-17 DIAGNOSIS — L820 Inflamed seborrheic keratosis: Secondary | ICD-10-CM | POA: Diagnosis not present

## 2023-11-30 DIAGNOSIS — Z860101 Personal history of adenomatous and serrated colon polyps: Secondary | ICD-10-CM | POA: Diagnosis not present

## 2023-11-30 DIAGNOSIS — K648 Other hemorrhoids: Secondary | ICD-10-CM | POA: Diagnosis not present

## 2023-11-30 DIAGNOSIS — Z09 Encounter for follow-up examination after completed treatment for conditions other than malignant neoplasm: Secondary | ICD-10-CM | POA: Diagnosis not present

## 2023-11-30 DIAGNOSIS — Z83719 Family history of colon polyps, unspecified: Secondary | ICD-10-CM | POA: Diagnosis not present

## 2023-12-16 DIAGNOSIS — Z0000 Encounter for general adult medical examination without abnormal findings: Secondary | ICD-10-CM | POA: Diagnosis not present

## 2023-12-30 DIAGNOSIS — R252 Cramp and spasm: Secondary | ICD-10-CM | POA: Diagnosis not present

## 2023-12-30 DIAGNOSIS — Z0000 Encounter for general adult medical examination without abnormal findings: Secondary | ICD-10-CM | POA: Diagnosis not present

## 2023-12-30 DIAGNOSIS — R7309 Other abnormal glucose: Secondary | ICD-10-CM | POA: Diagnosis not present

## 2024-02-02 DIAGNOSIS — H40013 Open angle with borderline findings, low risk, bilateral: Secondary | ICD-10-CM | POA: Diagnosis not present

## 2024-02-29 ENCOUNTER — Encounter: Payer: Self-pay | Admitting: Cardiovascular Disease

## 2024-02-29 ENCOUNTER — Ambulatory Visit: Attending: Cardiology | Admitting: Cardiovascular Disease

## 2024-02-29 VITALS — BP 142/80 | HR 57 | Resp 16 | Ht 74.0 in | Wt 191.6 lb

## 2024-02-29 DIAGNOSIS — L821 Other seborrheic keratosis: Secondary | ICD-10-CM | POA: Diagnosis not present

## 2024-02-29 DIAGNOSIS — L720 Epidermal cyst: Secondary | ICD-10-CM | POA: Diagnosis not present

## 2024-02-29 DIAGNOSIS — I480 Paroxysmal atrial fibrillation: Secondary | ICD-10-CM | POA: Diagnosis not present

## 2024-02-29 DIAGNOSIS — L820 Inflamed seborrheic keratosis: Secondary | ICD-10-CM | POA: Diagnosis not present

## 2024-02-29 DIAGNOSIS — D225 Melanocytic nevi of trunk: Secondary | ICD-10-CM | POA: Diagnosis not present

## 2024-02-29 DIAGNOSIS — R931 Abnormal findings on diagnostic imaging of heart and coronary circulation: Secondary | ICD-10-CM | POA: Diagnosis not present

## 2024-02-29 DIAGNOSIS — E785 Hyperlipidemia, unspecified: Secondary | ICD-10-CM | POA: Diagnosis not present

## 2024-02-29 DIAGNOSIS — L814 Other melanin hyperpigmentation: Secondary | ICD-10-CM | POA: Diagnosis not present

## 2024-02-29 MED ORDER — ROSUVASTATIN CALCIUM 5 MG PO TABS: 5.0000 mg | ORAL_TABLET | Freq: Every day | ORAL | 3 refills | Status: AC

## 2024-02-29 NOTE — Assessment & Plan Note (Signed)
 History of PAF status post A-fib ablation by Dr. Kelsie 11/11/2020 without recurrence.

## 2024-02-29 NOTE — Assessment & Plan Note (Signed)
 History of hyperlipidemia with recent lipid profile performed by his PCP 12/30/2023 revealing total cholesterol 206, LDL 120 and HDL of 79.  Given his mildly elevated coronary calcium score I am going to start him on low-dose rosuvastatin 5 mg a day and we will recheck a lipid liver profile in 3 months.  Also going to get an MMR lipid profile and LP(a).

## 2024-02-29 NOTE — Assessment & Plan Note (Signed)
 Coronary calcium score in 2017 was 0.  Repeat coronary calcium score performed 11/16/2023 was 10 all in the LAD territory.  He does have an LDL of 120, not at goal for secondary prevention.  I have suggested that he begin rosuvastatin 5 mg a day and we will recheck a lipid liver profile in 3 months.  I am also going to get an NMR LipoProfile and an LP(a).

## 2024-02-29 NOTE — Patient Instructions (Addendum)
 Medication Instructions:  Rosuvastatin 5 mg daily *If you need a refill on your cardiac medications before your next appointment, please call your pharmacy*  Lab Work: NMR, LPa- 1-2 weeks- fasting Lipid and Liver panels- 3 months-  No eating or drinking after midnight- water is ok. You can have this drawn at any [facility] [employer]- or come to our office at [address] [place], [place] [address], lab is on the 1st floor. If you have labs (blood work) drawn today and your tests are completely normal, you will receive your results only by: MyChart Message (if you have MyChart) OR A paper copy in the mail If you have any lab test that is abnormal or we need to change your treatment, we will call you to review the results.  Testing/Procedures: None ordered  Follow-Up: At [facility], you and your health needs are our priority.  As part of our continuing mission to provide you with exceptional heart care, our providers are all part of one team.  This team includes your primary Cardiologist (physician) and Advanced Practice Providers or APPs (Physician Assistants and Nurse Practitioners) who all work together to provide you with the care you need, when you need it.  Your next appointment:   6 month(s)  Provider:   Dr Court   We recommend signing up for the patient portal called MyChart.  Sign up information is provided on this After Visit Summary.  MyChart is used to connect with patients for Virtual Visits (Telemedicine).  Patients are able to view lab/test results, encounter notes, upcoming appointments, etc.  Non-urgent messages can be sent to your provider as well.   To learn more about what you can do with MyChart, go to [email or web address].

## 2024-02-29 NOTE — Progress Notes (Signed)
 02/29/2024 John Ellis   Jul 16, 1965  [id number]  Primary Physician John Carlin Redbird, MD Primary Cardiologist: John JINNY Lesches MD John Ellis, [name]:  John Ellis is a 57 y.o. thin and fit appearing married Caucasian male father of 3 daughters the youngest of which goes to [employer] who was referred by Dr. Redbird John, his PCP because of a mildly elevated coronary calcium score.  He works as a Geophysicist/field seismologist.  His risk factors include mild untreated hyperlipidemia.  He did have an A-fib ablation by Dr. Kelsie 11/11/2020 without recurrence.  He has obstructive sleep apnea on an mouth orthotic which apparently is effective.  He is very active, lifts weights and plays pickle ball without symptoms.  He did have a coronary calcium score in 2017 which was 0 and a follow-up 16/25/25 which was 10 all in the LAD territory.  He is completely asymptomatic.   Current Meds  Medication Sig   aspirin-acetaminophen -caffeine (EXCEDRIN MIGRAINE) 250-250-65 MG tablet Take 2 tablets by mouth as needed for headache.   EPINEPHrine 0.3 mg/0.3 mL IJ SOAJ injection Inject 0.3 mg into the muscle as needed for anaphylaxis.    levocetirizine (XYZAL) 5 MG tablet Take 5 mg by mouth as needed for allergies.   Multiple Vitamin (MULTIVITAMIN) tablet Take 3 tablets by mouth daily.   rosuvastatin (CRESTOR) 5 MG tablet Take 1 tablet (5 mg total) by mouth daily.   terbinafine (LAMISIL) 250 MG tablet Take 250 mg by mouth daily.     Allergies  Allergen Reactions   Wasp Venom Swelling   Yellow Jacket Venom Swelling    Social History   Socioeconomic History   Marital status: Married    Spouse name: Not on file   Number of children: Not on file   Years of education: Not on file   Highest education level: Not on file  Occupational History   Occupation: Estate manager/land agent  Tobacco Use   Smoking status: Never   Smokeless tobacco: Never  Vaping Use   Vaping status: Never Used   Substance and Sexual Activity   Alcohol use: Yes    Alcohol/week: 2.0 standard drinks of alcohol    Types: 1 Cans of beer, 1 Glasses of wine per week    Comment: Weekly.   Drug use: No   Sexual activity: Not on file  Other Topics Concern   Not on file  Social History Narrative   Not on file   Social Drivers of Health   Financial Resource Strain: Not on file  Food Insecurity: Not on file  Transportation Needs: Not on file  Physical Activity: Not on file  Stress: Not on file  Social Connections: Unknown (10/05/2021)   [facility]   Social Network    Social Network: Not on file  Intimate Partner Violence: Unknown (08/27/2021)   Received from [facility]   HITS    Physically Hurt: Not on file    Insult or Talk Down To: Not on file    Threaten Physical Harm: Not on file    Scream or Curse: Not on file     Review of Systems: General: negative for chills, fever, night sweats or weight changes.  Cardiovascular: negative for chest pain, dyspnea on exertion, edema, orthopnea, palpitations, paroxysmal nocturnal dyspnea or shortness of breath Dermatological: negative for rash Respiratory: negative for cough or wheezing Urologic: negative for hematuria Abdominal: negative for nausea, vomiting, diarrhea, bright red blood per rectum, melena,  or hematemesis Neurologic: negative for visual changes, syncope, or dizziness All other systems reviewed and are otherwise negative except as noted above.    Blood pressure (!) 142/80, pulse (!) 57, resp. rate 16, height 6' 2 (1.88 m), weight 191 lb 9.6 oz (86.9 kg), SpO2 98%.  General appearance: alert and no distress Neck: no adenopathy, no carotid bruit, no JVD, supple, symmetrical, trachea midline, and thyroid  not enlarged, symmetric, no tenderness/mass/nodules Lungs: clear to auscultation bilaterally Heart: regular rate and rhythm, S1, S2 normal, no murmur, click, rub or gallop Extremities: extremities normal, atraumatic,  no cyanosis or edema Pulses: 2+ and symmetric Skin: Skin color, texture, turgor normal. No rashes or lesions Neurologic: Grossly normal  EKG EKG Interpretation Date/Time:  Wednesday February 29 2024 13:44:05 EDT Ventricular Rate:  62 PR Interval:  158 QRS Duration:  104 QT Interval:  430 QTC Calculation: 436 R Axis:   32  Text Interpretation: Sinus rhythm with Premature atrial complexes Incomplete right bundle branch block When compared with ECG of 09-Dec-2020 15:47, Premature atrial complexes are now Present Incomplete right bundle branch block is now Present Confirmed by John Ellis [phone]) on 02/29/2024 1:47:48 PM    ASSESSMENT AND PLAN:   PAF (paroxysmal atrial fibrillation) (HCC) History of PAF status post A-fib ablation by Dr. Kelsie 11/11/2020 without recurrence.  Elevated coronary artery calcium score Coronary calcium score in 2017 was 0.  Repeat coronary calcium score performed 11/16/2023 was 10 all in the LAD territory.  He does have an LDL of 120, not at goal for secondary prevention.  I have suggested that he begin rosuvastatin 5 mg a day and we will recheck a lipid liver profile in 3 months.  I am also going to get an NMR LipoProfile and an LP(a).  Hyperlipidemia History of hyperlipidemia with recent lipid profile performed by his PCP 12/30/2023 revealing total cholesterol 206, LDL 120 and HDL of 79.  Given his mildly elevated coronary calcium score I am going to start him on low-dose rosuvastatin 5 mg a day and we will recheck a lipid liver profile in 3 months.  Also going to get an MMR lipid profile and LP(a).     Ellis John Court MD FACP,FACC,FAHA, [facility] 02/29/2024 2:09 PM

## 2024-03-06 ENCOUNTER — Encounter: Payer: Self-pay | Admitting: Cardiovascular Disease

## 2024-03-20 DIAGNOSIS — H40013 Open angle with borderline findings, low risk, bilateral: Secondary | ICD-10-CM | POA: Diagnosis not present

## 2024-03-22 ENCOUNTER — Ambulatory Visit: Admitting: Psychology

## 2024-03-22 DIAGNOSIS — E785 Hyperlipidemia, unspecified: Secondary | ICD-10-CM | POA: Diagnosis not present

## 2024-03-23 LAB — NMR, LIPOPROFILE
Cholesterol, Total: 154 mg/dL (ref 100–199)
HDL Particle Number: 29.4 umol/L — ABNORMAL LOW (ref >=30.5)
HDL-C: 67 mg/dL (ref >39)
LDL Particle Number: 723 nmol/L (ref <1000)
LDL Size: 21.3 nm (ref >20.5)
LDL-C (NIH Calc): 76 mg/dL (ref 0–99)
LP-IR Score: <25 (ref <=45)
Small LDL Particle Number: <90 nmol/L (ref <=527)
Triglycerides: 55 mg/dL (ref 0–149)

## 2024-03-23 LAB — LIPOPROTEIN A (LPA): Lipoprotein (a): 121.4 nmol/L — ABNORMAL HIGH (ref <75.0)

## 2024-03-24 ENCOUNTER — Ambulatory Visit: Payer: Self-pay | Admitting: Cardiovascular Disease

## 2024-03-24 DIAGNOSIS — R931 Abnormal findings on diagnostic imaging of heart and coronary circulation: Secondary | ICD-10-CM

## 2024-03-24 DIAGNOSIS — E785 Hyperlipidemia, unspecified: Secondary | ICD-10-CM

## 2024-04-03 ENCOUNTER — Ambulatory Visit: Admitting: Psychology

## 2024-04-03 DIAGNOSIS — F4321 Adjustment disorder with depressed mood: Secondary | ICD-10-CM
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
## 2024-04-17 ENCOUNTER — Ambulatory Visit: Admitting: Psychology

## 2024-04-17 DIAGNOSIS — F4321 Adjustment disorder with depressed mood: Secondary | ICD-10-CM | POA: Diagnosis not present
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
## 2024-04-23 DIAGNOSIS — M160 Bilateral primary osteoarthritis of hip: Secondary | ICD-10-CM | POA: Diagnosis not present

## 2024-04-23 DIAGNOSIS — M1611 Unilateral primary osteoarthritis, right hip: Secondary | ICD-10-CM | POA: Diagnosis not present

## 2024-05-03 ENCOUNTER — Ambulatory Visit: Admitting: Psychology

## 2024-05-03 DIAGNOSIS — F4321 Adjustment disorder with depressed mood: Secondary | ICD-10-CM

## 2024-05-03 DIAGNOSIS — M160 Bilateral primary osteoarthritis of hip: Secondary | ICD-10-CM | POA: Diagnosis not present

## 2024-05-03 NOTE — Progress Notes (Signed)
 [facility] Counselor/Therapist Progress Note  Patient ID: Javaughn Opdahl, MRN: [id number],    Date: 05/03/2024  Time Spent: 60 mins      start time:  1700   end time: 1800  Treatment Type: Family Therapy  Reported Symptoms: Pts ([name] and Rich) present for the follow up session in person in the office, both granting consent for the session.   Mental Status Exam: Appearance:  Casual     Behavior: Appropriate  Motor: Normal  Speech/Language:  Clear and Coherent  Affect: Appropriate  Mood: normal  Thought process: normal  Thought content:   WNL  Sensory/Perceptual disturbances:   WNL  Orientation: oriented to person, place, and time/date  Attention: Good  Concentration: Good  Memory: WNL  Fund of knowledge:  Good  Insight:   Good  Judgment:  Good  Impulse Control: Good   Risk Assessment: Danger to Self:  No Self-injurious Behavior: No Danger to Others: No Duty to Warn:no Physical Aggression / Violence:No  Access to Firearms a concern: No  Gang Involvement:No   Notified of retirement  Note: next session will meet with each person individually  Subjective: Talked with clients about individual sessions at our next session; explained the process for them and we will do that next time.  [name] shares that he hurt his hip on Thanksgiving and he has seen an ortho and will likely have to have hip replacements in the next 5-10 years.  He has seen a PT for the first time recently; he has not been sleeping well because of this discomfort.  Their oldest daughter is waiting to hear whether of not she gets into [employer] and [employer]; she wants to be an art gallery manager.  Their youngest daughter is also taking driver's ed.  Pts did take the 5 Love Languages quizzes and they share the love language Quality Time.  Talked with them about the benefits of being intentional about scheduling time to spend together in this way engaging in whatever activities they prefer.  Also talked with  clients about Listening to Understand and expecting the best of intentions from each other and giving each other grace in their interactions.  Encouraged pts to continue with their self care activities and we will meet in 5 wks for a follow up session, due to the holidays.  Interventions: Cognitive Behavioral Therapy  Diagnosis:Adjustment disorder with depressed mood  Plan: Treatment Plan Strengths/Abilities:  Intelligent, Intuitive, Willing to participate in therapy Treatment Preferences:  Outpatient Individual Therapy Statement of Needs:  Patient is to use CBT, mindfulness and coping skills to help manage and/or decrease symptoms associated with their diagnosis. Symptoms:  Depressed/Irritable mood, worry, social withdrawal Problems Addressed:  Depressive thoughts, Sadness, Sleep issues, etc. Long Term Goals:  Pt to reduce overall level, frequency, and intensity of the feelings of depression as evidenced by decreased irritability, negative self talk, and helpless feelings from 6 to 7 days/week to 0 to 1 days/week, per client report, for at least 3 consecutive months.  Progress: 30% Short Term Goals:  Pt to verbally express understanding of the relationship between feelings of depression and their impact on thinking patterns and behaviors.  Pt to verbalize an understanding of the role that distorted thinking plays in creating fears, excessive worry, and ruminations.  Progress: 30% Target Date:  04/17/2025 Frequency:  Bi-weekly Modality:  Cognitive Behavioral Therapy Interventions by Therapist:  Therapist will use CBT, Mindfulness exercises, Coping skills and Referrals, as needed by client. Client has verbally approved this treatment plan.  Francis KATHEE Macintosh, [facility]               Francis KATHEE Macintosh, [facility]

## 2024-05-07 ENCOUNTER — Encounter: Payer: Self-pay | Admitting: Cardiovascular Disease

## 2024-05-07 DIAGNOSIS — E785 Hyperlipidemia, unspecified: Secondary | ICD-10-CM

## 2024-05-07 DIAGNOSIS — M160 Bilateral primary osteoarthritis of hip: Secondary | ICD-10-CM | POA: Diagnosis not present

## 2024-05-11 DIAGNOSIS — M160 Bilateral primary osteoarthritis of hip: Secondary | ICD-10-CM | POA: Diagnosis not present

## 2024-05-11 DIAGNOSIS — E785 Hyperlipidemia, unspecified: Secondary | ICD-10-CM | POA: Diagnosis not present

## 2024-05-12 LAB — NMR, LIPOPROFILE
Cholesterol, Total: 150 mg/dL (ref 100–199)
HDL Particle Number: 30.9 umol/L
HDL-C: 63 mg/dL
LDL Particle Number: 655 nmol/L
LDL Size: 21.2 nm
LDL-C (NIH Calc): 73 mg/dL (ref 0–99)
LP-IR Score: 34
Small LDL Particle Number: <90 nmol/L
Triglycerides: 70 mg/dL (ref 0–149)

## 2024-05-13 ENCOUNTER — Ambulatory Visit: Payer: Self-pay | Admitting: Cardiovascular Disease

## 2024-05-22 DIAGNOSIS — M160 Bilateral primary osteoarthritis of hip: Secondary | ICD-10-CM | POA: Diagnosis not present

## 2024-06-05 ENCOUNTER — Ambulatory Visit: Admitting: Psychology

## 2024-06-07 LAB — HEPATIC FUNCTION PANEL
ALT: 44 IU/L (ref 0–44)
AST: 34 IU/L (ref 0–40)
Albumin: 4.7 g/dL (ref 3.8–4.9)
Alkaline Phosphatase: 86 IU/L (ref 47–123)
Bilirubin Total: 0.6 mg/dL (ref 0.0–1.2)
Bilirubin, Direct: 0.22 mg/dL (ref 0.00–0.40)
Total Protein: 6.7 g/dL (ref 6.0–8.5)

## 2024-06-07 LAB — LIPID PANEL
Chol/HDL Ratio: 2.4 ratio (ref 0.0–5.0)
Cholesterol, Total: 163 mg/dL (ref 100–199)
HDL: 68 mg/dL
LDL Chol Calc (NIH): 86 mg/dL (ref 0–99)
Triglycerides: 41 mg/dL (ref 0–149)
VLDL Cholesterol Cal: 9 mg/dL (ref 5–40)

## 2024-06-13 NOTE — Addendum Note (Signed)
 Addended by: Deryk Bozman L on: 06/13/2024 05:12 PM   Modules accepted: Orders

## 2024-06-15 LAB — NMR, LIPOPROFILE
Cholesterol, Total: 169 mg/dL (ref 100–199)
HDL Particle Number: 31.2 umol/L
HDL-C: 72 mg/dL
LDL Particle Number: 760 nmol/L
LDL Size: 21.3 nm
LDL-C (NIH Calc): 85 mg/dL (ref 0–99)
LP-IR Score: <25
Small LDL Particle Number: <90 nmol/L
Triglycerides: 60 mg/dL (ref 0–149)

## 2024-06-19 ENCOUNTER — Other Ambulatory Visit: Payer: Self-pay | Admitting: Urology

## 2024-06-19 DIAGNOSIS — R972 Elevated prostate specific antigen [PSA]: Secondary | ICD-10-CM

## 2024-06-19 MED ORDER — EZETIMIBE 10 MG PO TABS: 10.0000 mg | ORAL_TABLET | Freq: Every day | ORAL | 3 refills | Status: AC

## 2024-06-19 NOTE — Addendum Note (Signed)
 Addended by: Ruben Pyka L on: 06/19/2024 02:13 PM   Modules accepted: Orders

## 2024-06-20 ENCOUNTER — Encounter: Payer: Self-pay | Admitting: Urology

## 2024-07-03 ENCOUNTER — Other Ambulatory Visit

## 2024-09-03 ENCOUNTER — Ambulatory Visit: Admitting: Cardiovascular Disease
